# Patient Record
Sex: Female | Born: 1960 | Race: Black or African American | Hispanic: No | Marital: Married | Smoking: Current every day smoker
Health system: Southern US, Community
[De-identification: ages and names within clinical notes are randomized; demographics above are authoritative.]

## PROBLEM LIST (undated history)

## (undated) DIAGNOSIS — K219 Gastro-esophageal reflux disease without esophagitis: Secondary | ICD-10-CM

## (undated) DIAGNOSIS — R011 Cardiac murmur, unspecified: Secondary | ICD-10-CM

## (undated) DIAGNOSIS — I10 Essential (primary) hypertension: Secondary | ICD-10-CM

## (undated) DIAGNOSIS — IMO0002 Reserved for concepts with insufficient information to code with codable children: Secondary | ICD-10-CM

## (undated) HISTORY — DX: Cardiac murmur, unspecified: R01.1

## (undated) HISTORY — PX: WISDOM TOOTH EXTRACTION: SHX21

## (undated) HISTORY — PX: APPENDECTOMY: SHX54

## (undated) HISTORY — DX: Gastro-esophageal reflux disease without esophagitis: K21.9

## (undated) HISTORY — PX: TUBAL LIGATION: SHX77

## (undated) HISTORY — DX: Reserved for concepts with insufficient information to code with codable children: IMO0002

---

## 1998-07-24 ENCOUNTER — Emergency Department (HOSPITAL_COMMUNITY): Admission: EM | Admit: 1998-07-24 | Discharge: 1998-07-24 | Payer: Self-pay | Admitting: Family Medicine

## 2000-02-18 ENCOUNTER — Emergency Department (HOSPITAL_COMMUNITY): Admission: EM | Admit: 2000-02-18 | Discharge: 2000-02-18 | Payer: Self-pay | Admitting: Emergency Medicine

## 2000-02-18 ENCOUNTER — Encounter: Payer: Self-pay | Admitting: Emergency Medicine

## 2004-11-28 ENCOUNTER — Ambulatory Visit: Payer: Self-pay | Admitting: Internal Medicine

## 2004-11-29 ENCOUNTER — Ambulatory Visit: Payer: Self-pay | Admitting: *Deleted

## 2010-03-08 ENCOUNTER — Ambulatory Visit: Payer: Self-pay | Admitting: Family Medicine

## 2012-09-27 ENCOUNTER — Ambulatory Visit (INDEPENDENT_AMBULATORY_CARE_PROVIDER_SITE_OTHER): Payer: PRIVATE HEALTH INSURANCE | Admitting: Family Medicine

## 2012-09-27 ENCOUNTER — Ambulatory Visit: Payer: PRIVATE HEALTH INSURANCE

## 2012-09-27 VITALS — BP 152/82 | HR 83 | Temp 97.9°F | Resp 18 | Ht 60.0 in | Wt 144.0 lb

## 2012-09-27 DIAGNOSIS — Z8701 Personal history of pneumonia (recurrent): Secondary | ICD-10-CM

## 2012-09-27 DIAGNOSIS — R0602 Shortness of breath: Secondary | ICD-10-CM

## 2012-09-27 DIAGNOSIS — R0989 Other specified symptoms and signs involving the circulatory and respiratory systems: Secondary | ICD-10-CM

## 2012-09-27 DIAGNOSIS — R062 Wheezing: Secondary | ICD-10-CM

## 2012-09-27 MED ORDER — ALBUTEROL SULFATE HFA 108 (90 BASE) MCG/ACT IN AERS: 2.0000 | INHALATION_SPRAY | RESPIRATORY_TRACT | Status: DC | PRN

## 2012-09-27 MED ORDER — PREDNISONE 20 MG PO TABS: ORAL_TABLET | ORAL | Status: DC

## 2012-09-27 MED ORDER — FLUCONAZOLE 150 MG PO TABS: 150.0000 mg | ORAL_TABLET | Freq: Once | ORAL | Status: DC

## 2012-09-27 MED ORDER — ALBUTEROL SULFATE (2.5 MG/3ML) 0.083% IN NEBU
2.5000 mg | INHALATION_SOLUTION | Freq: Once | RESPIRATORY_TRACT | Status: AC
  Administered 2012-09-27: 2.5 mg via RESPIRATORY_TRACT

## 2012-09-27 MED ORDER — DOXYCYCLINE HYCLATE 100 MG PO CAPS: 100.0000 mg | ORAL_CAPSULE | Freq: Two times a day (BID) | ORAL | Status: DC

## 2012-09-27 NOTE — Patient Instructions (Addendum)
1. Wheezing  albuterol (PROVENTIL) (2.5 MG/3ML) 0.083% nebulizer solution 2.5 mg, DG Chest 2 View, predniSONE (DELTASONE) 20 MG tablet, albuterol (PROVENTIL HFA;VENTOLIN HFA) 108 (90 BASE) MCG/ACT inhaler  2. SOB (shortness of breath)  albuterol (PROVENTIL) (2.5 MG/3ML) 0.083% nebulizer solution 2.5 mg, DG Chest 2 View  3. Chest congestion  albuterol (PROVENTIL) (2.5 MG/3ML) 0.083% nebulizer solution 2.5 mg, DG Chest 2 View, doxycycline (VIBRAMYCIN) 100 MG capsule, fluconazole (DIFLUCAN) 150 MG tablet  4. Hx of bacterial pneumonia  albuterol (PROVENTIL) (2.5 MG/3ML) 0.083% nebulizer solution 2.5 mg, DG Chest 2 View

## 2012-09-27 NOTE — Progress Notes (Signed)
[address]   [place], [place]  [address]   [phone]  Subjective:    Patient ID: Jenny Montgomery, female    DOB: Jan 30, 1961, 51 y.o.   MRN: [id number]  HPIThis 51 y.o. female presents for evaluation of cough.  Onset of congestion in chest; onset one month.  +head congestion with improvement; now mostly in chest.  +intermittent fever Tmax 99.  +sweats; no chills.  Some headache; +ear pain B; no sore throat; +PND; no nasal congestion; no rhinorrhea; yellow sputum production; +cough mostly at night but still coughing during the day.  +SOB.  No history of asthma; +tobacco abuse 1 ppd.  +wheezing; last illness with pneumonia with wheezing.  No vomiting or diarrhea.  OTC medication everything: Mucinex, Tylenol.  Works as Lawyer at [employer].     Review of Systems  Constitutional: Positive for fever. Negative for chills, diaphoresis and fatigue.  HENT: Positive for ear pain, congestion, rhinorrhea and postnasal drip. Negative for sore throat, sneezing, drooling, mouth sores, trouble swallowing, dental problem, voice change and sinus pressure.   Respiratory: Positive for cough, shortness of breath and wheezing. Negative for stridor.   Cardiovascular: Negative for chest pain, palpitations and leg swelling.  Gastrointestinal: Negative for nausea, vomiting and diarrhea.  Skin: Negative for rash.       Past Medical History  Diagnosis Date  . Heart murmur   . Ulcer   . GERD (gastroesophageal reflux disease)     Past Surgical History  Procedure Date  . Wisdom tooth extraction   . Appendectomy   . Cesarean section     2 c sections    Prior to Admission medications   Not on File    No Known Allergies  History   Social History  . Marital Status: Married    Spouse Name: N/A    Number of Children: N/A  . Years of Education: N/A   Occupational History  . Not on file.   Social History Main Topics  . Smoking status: Current Every Day Smoker  . Smokeless tobacco: Not on  file  . Alcohol Use: No  . Drug Use: Not on file  . Sexually Active: Not on file   Other Topics Concern  . Not on file   Social History Narrative  . No narrative on file    Family History  Problem Relation Age of Onset  . Kidney disease Mother   . Hypertension Mother   . Anemia Mother   . Cancer Father     throat cancer  . Hypertension Father   . Heart disease Maternal Grandmother   . Glaucoma Maternal Grandmother     Objective:   Physical Exam  Nursing note and vitals reviewed. Constitutional: She appears well-developed and well-nourished. No distress.  HENT:  Head: Normocephalic and atraumatic.  Right Ear: External ear normal.  Left Ear: External ear normal.  Nose: Nose normal.  Mouth/Throat: Oropharynx is clear and moist. No oropharyngeal exudate.  Eyes: Conjunctivae normal and EOM are normal. Pupils are equal, round, and reactive to light.  Neck: Normal range of motion. Neck supple. No thyromegaly present.  Cardiovascular: Normal rate, regular rhythm, normal heart sounds and intact distal pulses.   No murmur heard. Pulmonary/Chest: No accessory muscle usage. Not tachypneic. No respiratory distress. She has wheezes in the right upper field, the right middle field, the right lower field, the left upper field, the left middle field and the left lower field. She has rhonchi in the right  upper field, the right middle field, the right lower field, the left upper field, the left middle field and the left lower field. She has no rales.       MODERATE AIR MOVEMENT; DIFFUSE WHEEZING AND RHONCHI THROUGHOUT.   NO TACHYPNEA; NO RETRACTIONS; NO ACCESSORY MUSCLE USE.  Musculoskeletal: She exhibits no edema.  Lymphadenopathy:    She has no cervical adenopathy.  Skin: Skin is warm and dry. No rash noted. She is not diaphoretic.  Psychiatric: She has a normal mood and affect. Her behavior is normal. Judgment and thought content normal.       [facility] reading (PRIMARY) by  Dr. Katrinka Blazing.   CXR: NAD  ALBUTEROL NEBULIZER ADMINISTERED DURING VISIT.  Improved air movement after nebulizer treatment; persistent rhonchi and wheezing; no tachypnea.   Assessment & Plan:   1. Wheezing  albuterol (PROVENTIL) (2.5 MG/3ML) 0.083% nebulizer solution 2.5 mg, DG Chest 2 View  2. SOB (shortness of breath)  albuterol (PROVENTIL) (2.5 MG/3ML) 0.083% nebulizer solution 2.5 mg, DG Chest 2 View  3. Chest congestion  albuterol (PROVENTIL) (2.5 MG/3ML) 0.083% nebulizer solution 2.5 mg, DG Chest 2 View  4. Hx of bacterial pneumonia  albuterol (PROVENTIL) (2.5 MG/3ML) 0.083% nebulizer solution 2.5 mg, DG Chest 2 View    1.  Acute Bronchitis:  New.  Treat with Doxycycline.  Supportive care with rest, fluids, Mucinex DM.  Rx for Diflucan also provided per patient request. 2.  Bronchospasm: New.  S/p Albuterol nebulizer in office; rx for Prednisone, Albuterol HFA to use q 6 hours scheduled for one week and then PRN.  Discussed acute bronchospasm highly suggestive of COPD; recommend spirometry in future when well.  RTC for acute worsening. 3. Tobacco Abuse: pre-contemplative; encourage cessation with current bronchospasm.  Meds ordered this encounter  Medications  . albuterol (PROVENTIL) (2.5 MG/3ML) 0.083% nebulizer solution 2.5 mg    Sig:   . doxycycline (VIBRAMYCIN) 100 MG capsule    Sig: Take 1 capsule (100 mg total) by mouth 2 (two) times daily.    Dispense:  20 capsule    Refill:  0  . predniSONE (DELTASONE) 20 MG tablet    Sig: Two tablets daily x 5 days then one tablet daily x 5 days    Dispense:  15 tablet    Refill:  0  . albuterol (PROVENTIL HFA;VENTOLIN HFA) 108 (90 BASE) MCG/ACT inhaler    Sig: Inhale 2 puffs into the lungs every 4 (four) hours as needed for wheezing (cough, shortness of breath or wheezing.).    Dispense:  1 Inhaler    Refill:  1  . fluconazole (DIFLUCAN) 150 MG tablet    Sig: Take 1 tablet (150 mg total) by mouth once. Repeat if needed    Dispense:  2 tablet     Refill:  0

## 2012-10-07 ENCOUNTER — Ambulatory Visit: Payer: PRIVATE HEALTH INSURANCE

## 2012-10-07 ENCOUNTER — Ambulatory Visit (INDEPENDENT_AMBULATORY_CARE_PROVIDER_SITE_OTHER): Payer: PRIVATE HEALTH INSURANCE | Admitting: Emergency Medicine

## 2012-10-07 VITALS — BP 162/88 | HR 75 | Temp 97.9°F | Resp 17 | Ht 60.0 in | Wt 145.0 lb

## 2012-10-07 DIAGNOSIS — J40 Bronchitis, not specified as acute or chronic: Secondary | ICD-10-CM

## 2012-10-07 DIAGNOSIS — J209 Acute bronchitis, unspecified: Secondary | ICD-10-CM

## 2012-10-07 DIAGNOSIS — R05 Cough: Secondary | ICD-10-CM

## 2012-10-07 MED ORDER — LEVOFLOXACIN 500 MG PO TABS: 500.0000 mg | ORAL_TABLET | Freq: Every day | ORAL | Status: DC

## 2012-10-07 MED ORDER — HYDROCOD POLST-CHLORPHEN POLST 10-8 MG/5ML PO LQCR: 5.0000 mL | Freq: Two times a day (BID) | ORAL | Status: DC | PRN

## 2012-10-07 NOTE — Progress Notes (Signed)
  Subjective:    Patient ID: Jenny Montgomery, female    DOB: 07-04-1961, 51 y.o.   MRN: [id number]  Back Pain This is a new problem. The current episode started in the past 7 days. The problem occurs constantly. The problem has been gradually worsening since onset. The quality of the pain is described as burning. The pain does not radiate. The symptoms are aggravated by coughing. Associated symptoms include chest pain and a fever. Pertinent negatives include no dysuria.  Chest Pain  This is a new problem. The current episode started in the past 7 days. The onset quality is gradual. The problem occurs constantly. The problem has been gradually worsening. The quality of the pain is described as sharp. The pain does not radiate. Associated symptoms include back pain, a cough (productive purulent sputum), a fever, malaise/fatigue and sputum production. Pertinent negatives include no nausea or shortness of breath. The cough has no precipitants. The cough is productive. There is no color change associated with the cough. Nothing relieves the cough. Nothing worsens the cough.      Review of Systems  Constitutional: Positive for fever, chills, malaise/fatigue and fatigue. Negative for activity change and appetite change.  HENT: Negative for congestion, rhinorrhea, postnasal drip and ear discharge.   Eyes: Negative for discharge, redness and itching.  Respiratory: Positive for cough (productive purulent sputum) and sputum production. Negative for shortness of breath.   Cardiovascular: Positive for chest pain.  Gastrointestinal: Negative for nausea.  Genitourinary: Negative for dysuria.  Musculoskeletal: Positive for back pain.  Skin: Negative.        Objective:   Physical Exam  Constitutional: She appears well-developed and well-nourished. No distress.  HENT:  Head: Normocephalic and atraumatic.  Right Ear: External ear normal.  Left Ear: External ear normal.  Nose: Nose normal.  Mouth/Throat:  Oropharynx is clear and moist.  Eyes: Conjunctivae normal and EOM are normal. Pupils are equal, round, and reactive to light. Right eye exhibits no discharge. No scleral icterus.  Neck: Normal range of motion. Neck supple.  Cardiovascular: Normal rate, regular rhythm and normal heart sounds.   Pulmonary/Chest: Effort normal. No respiratory distress. She has rales.  Abdominal: Soft. Bowel sounds are normal.  Musculoskeletal: Normal range of motion.  Lymphadenopathy:    She has no cervical adenopathy.  Neurological: She is alert.  Skin: Skin is warm and dry.          Assessment & Plan:  Bronchitis CXR  [facility] reading (PRIMARY) by  Dr. Dareen Piano.  Negative chest..

## 2012-10-09 NOTE — Progress Notes (Signed)
Reviewed and agree.

## 2012-10-14 NOTE — Progress Notes (Signed)
Reviewed and agree.

## 2013-08-20 ENCOUNTER — Ambulatory Visit (INDEPENDENT_AMBULATORY_CARE_PROVIDER_SITE_OTHER): Payer: PRIVATE HEALTH INSURANCE | Admitting: Family Medicine

## 2013-08-20 ENCOUNTER — Ambulatory Visit: Payer: PRIVATE HEALTH INSURANCE

## 2013-08-20 VITALS — BP 152/72 | HR 86 | Temp 97.7°F | Resp 16 | Ht 60.0 in | Wt 145.0 lb

## 2013-08-20 DIAGNOSIS — M719 Bursopathy, unspecified: Secondary | ICD-10-CM

## 2013-08-20 DIAGNOSIS — M25519 Pain in unspecified shoulder: Secondary | ICD-10-CM

## 2013-08-20 DIAGNOSIS — M7582 Other shoulder lesions, left shoulder: Secondary | ICD-10-CM

## 2013-08-20 DIAGNOSIS — M25512 Pain in left shoulder: Secondary | ICD-10-CM

## 2013-08-20 DIAGNOSIS — R209 Unspecified disturbances of skin sensation: Secondary | ICD-10-CM

## 2013-08-20 DIAGNOSIS — M67919 Unspecified disorder of synovium and tendon, unspecified shoulder: Secondary | ICD-10-CM

## 2013-08-20 DIAGNOSIS — R202 Paresthesia of skin: Secondary | ICD-10-CM

## 2013-08-20 MED ORDER — CYCLOBENZAPRINE HCL 5 MG PO TABS: 5.0000 mg | ORAL_TABLET | Freq: Three times a day (TID) | ORAL | Status: DC | PRN

## 2013-08-20 NOTE — Progress Notes (Signed)
[facility]:  Office Visit  Chief Complaint:  Chief Complaint  Patient presents with  . Arm Pain    3 weeks    HPI: Jenny Montgomery is a 52 y.o. female who complains of  Here with left arm and shoulder pain x 3 weeks, initially started with left shoulder and now has some more pain and swelling in arm and hand. Feels like a  Toothache, she feels some numbness, If  she moves it gets better. 10/10 pain when she ha severe pain but she normally has 8/10 intermittent pain. Has sharp pains occassionally. She works at [employer] and does a lot of lifting and using the lift machine to get people in and out of bed, baths, etc. She works as a Lawyer. She does not remember the exact moment she got injured, they do rotations so she lifts people of different body masses so it is not all the time that she stays with the same resident.  Denies diabetes. She  has some right sided shoulder pain. She has tried bayer aspirin but it tears up her stomach, she has tried tylenol. She denies any prior neck or shoulder injuries.  She is a smoker  1 ppd or less x 30-35 years.   Past Medical History  Diagnosis Date  . Heart murmur   . Ulcer   . GERD (gastroesophageal reflux disease)    Past Surgical History  Procedure Laterality Date  . Wisdom tooth extraction    . Appendectomy    . Cesarean section      2 c sections  . Tubal ligation     History   Social History  . Marital Status: Married    Spouse Name: N/A    Number of Children: N/A  . Years of Education: N/A   Social History Main Topics  . Smoking status: Current Every Day Smoker -- 0.50 packs/day for 30 years    Types: Cigarettes  . Smokeless tobacco: Never Used  . Alcohol Use: No  . Drug Use: No  . Sexual Activity: None   Other Topics Concern  . 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   Family History  Problem Relation Age of Onset  . Kidney disease Mother   . Hypertension Mother   . Anemia Mother   . Stroke Mother 83  . Cancer Father     throat cancer  . Hypertension Father   . Heart disease Maternal Grandmother   . Glaucoma Maternal Grandmother   . Diabetes Sister    Allergies  Allergen Reactions  . Ibuprofen Other (See Comments)    ulcers   Prior to Admission medications   Medication Sig Start Date End Date Taking? Authorizing Provider  albuterol (PROVENTIL HFA;VENTOLIN HFA) 108 (90 BASE) MCG/ACT inhaler Inhale 2 puffs into the lungs every 4 (four) hours as needed for wheezing (cough, shortness of breath or wheezing.). 09/27/12   Ethelda Chick, MD  chlorpheniramine-HYDROcodone (TUSSIONEX PENNKINETIC ER) 10-8 MG/5ML LQCR Take 5 mLs by mouth every 12 (twelve) hours as needed (cough). 10/07/12   Phillips Odor, MD  doxycycline (VIBRAMYCIN) 100 MG capsule Take 1 capsule (100 mg total) by mouth 2 (two) times daily. 09/27/12   Ethelda Chick, MD  fluconazole (DIFLUCAN) 150 MG tablet Take 1  tablet (150 mg total) by mouth once. Repeat if needed 09/27/12   Ethelda Chick, MD  levofloxacin (LEVAQUIN) 500 MG tablet Take 1 tablet (500 mg total) by mouth daily. 10/07/12   Phillips Odor, MD  predniSONE (DELTASONE) 20 MG tablet Two tablets daily x 5 days then one tablet daily x 5 days 09/27/12   Ethelda Chick, MD     ROS: The patient denies fevers, chills, night sweats, unintentional weight loss, chest pain, palpitations, wheezing, dyspnea on exertion, nausea, vomiting, abdominal pain, dysuria, hematuria, melena, numbness, weakness, or tingling.   All other systems have been reviewed and were otherwise negative with the exception of those mentioned in the HPI and as above.    PHYSICAL EXAM: Filed Vitals:   08/20/13 1327  BP: 152/72  Pulse: 86  Temp: 97.7 F (36.5 C)  Resp: 16   Filed Vitals:   08/20/13 1327  Height: 5' (1.524 m)  Weight: 145 lb  (65.772 kg)   Body mass index is 28.32 kg/(m^2).  General: Alert, no acute distress HEENT:  Normocephalic, atraumatic, oropharynx patent. EOMI, PERRLA Cardiovascular:  Regular rate and rhythm, no rubs murmurs or gallops.  No Carotid bruits, radial pulse intact. No pedal edema.  Respiratory: Clear to auscultation bilaterally.  No wheezes, rales, or rhonchi.  No cyanosis, no use of accessory musculature GI: No organomegaly, abdomen is soft and non-tender, positive bowel sounds.  No masses. Skin: No rashes. Neurologic: Facial musculature symmetric. Psychiatric: Patient is appropriate throughout our interaction. Lymphatic: No cervical lymphadenopathy Musculoskeletal: Gait intact. c spine-normal ROM, neg Spurling, non tender Left shoulder normal AROM/PROM , pain with overhead motion , +/-Neers, Hawkins Neg Speeds, Empty Can She has difficulty with Lift off test  But this is bilateral.   LABS: No results found for this or any previous visit.   EKG/XRAY:   Primary read interpreted by Dr. Conley Rolls at [facility]. No fx or dislocation of c spine No fx or dislocation of Shoulder    ASSESSMENT/PLAN: Encounter Diagnoses  Name Primary?  . Left shoulder pain Yes  . Paresthesia of left arm   . Rotator cuff tendonitis, left    Patient declined tramadol, declined steroids ( makes her gain weight), mobic or NSAID since it tears up her stomach Rest and  ROM exercises given, Work note given, avoid overhead repetitive motion during time off Rx flexeril prn F/u prn Gross sideeffects, risk and benefits, and alternatives of medications d/w patient. Patient is aware that all medications have potential sideeffects and we are unable to predict every sideeffect or drug-drug interaction that may occur.  Hamilton Capri PHUONG, DO 08/20/2013 4:28 PM

## 2013-08-20 NOTE — Patient Instructions (Signed)

## 2014-01-31 IMAGING — CR DG CERVICAL SPINE 2 OR 3 VIEWS
3 series · 3 of 3 positions shown · non-contrast
Comparison: None.

CLINICAL DATA: Left arm paresthesia.

CERVICAL SPINE - 2-3 VIEW

[lateral]
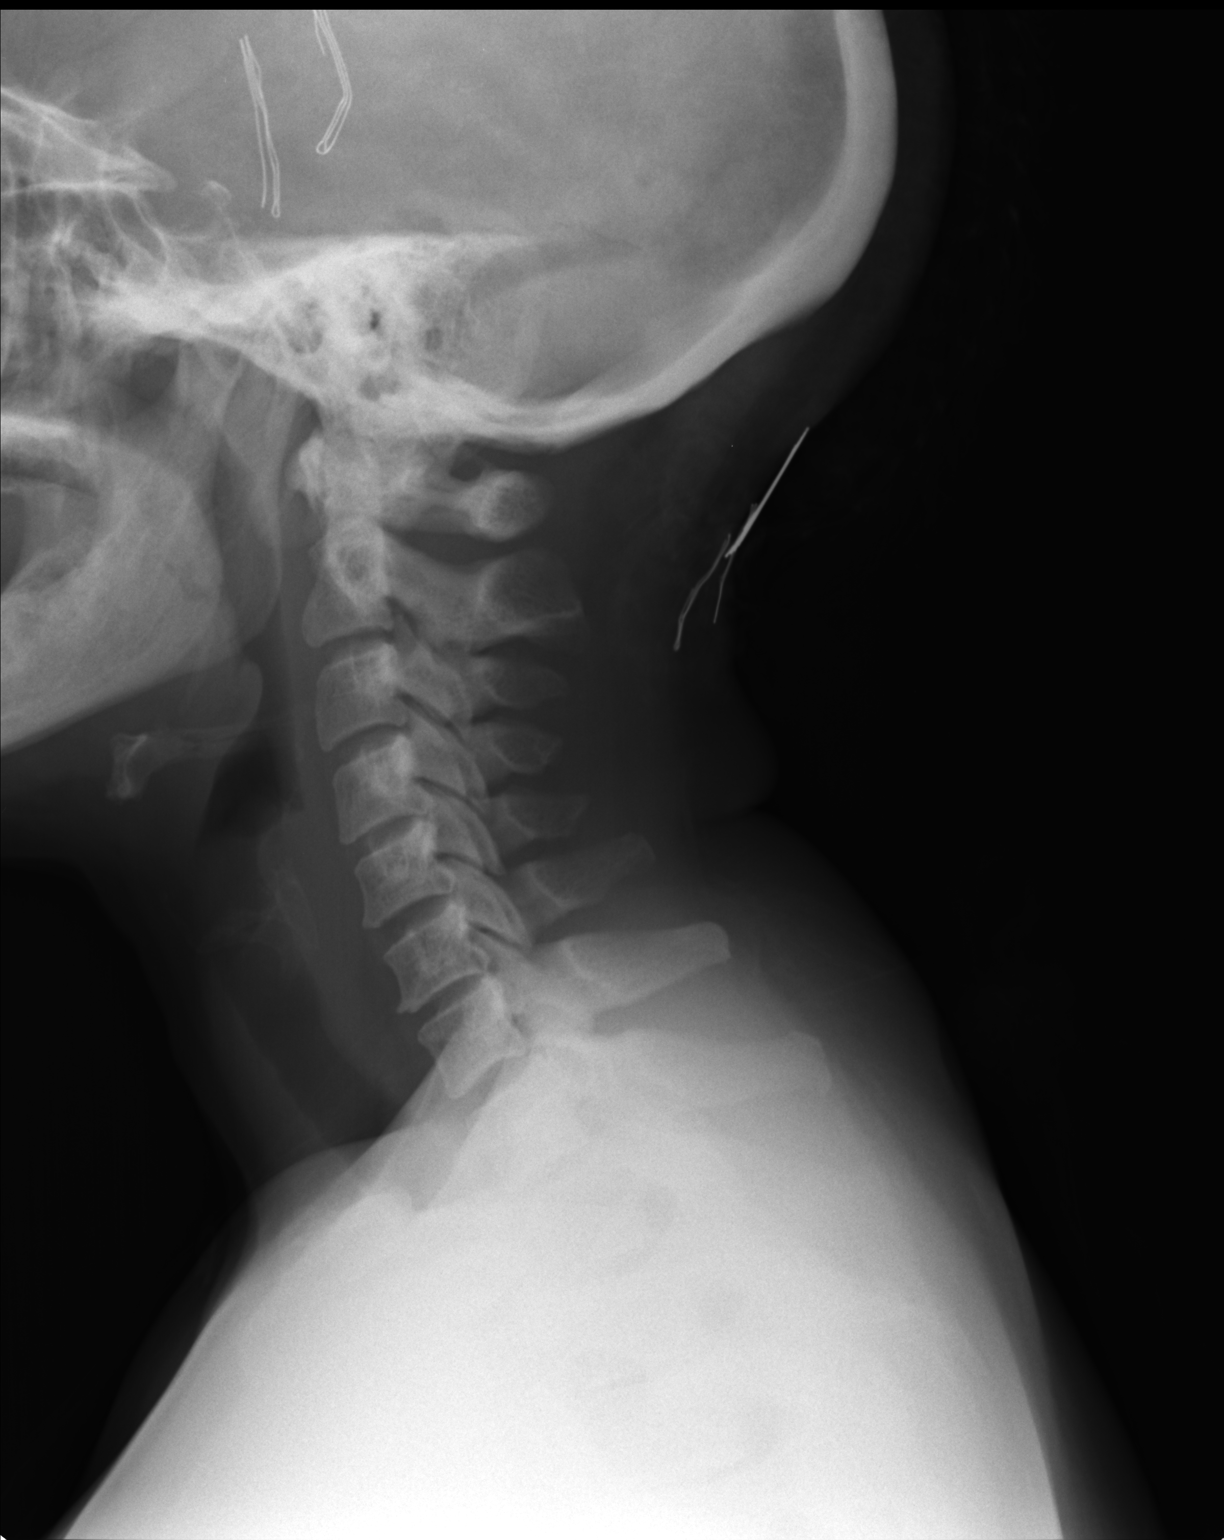

[AP (1 of 2)]
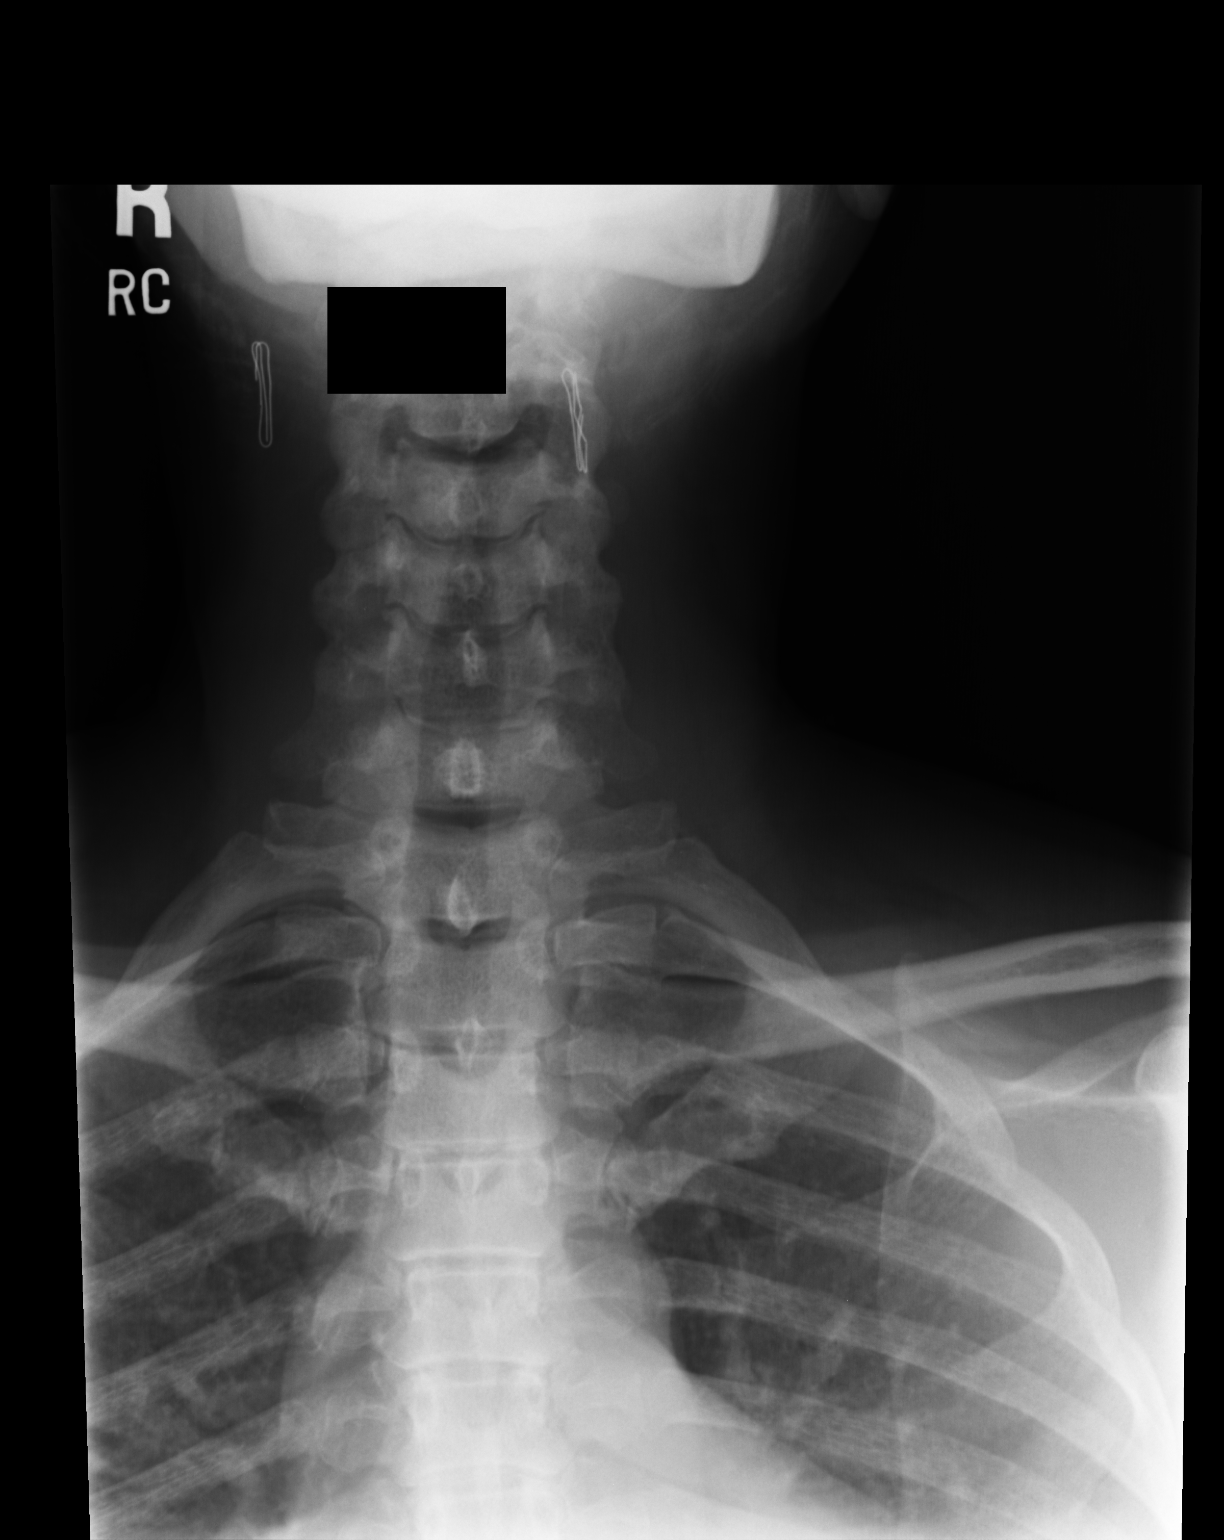

[AP (2 of 2)]
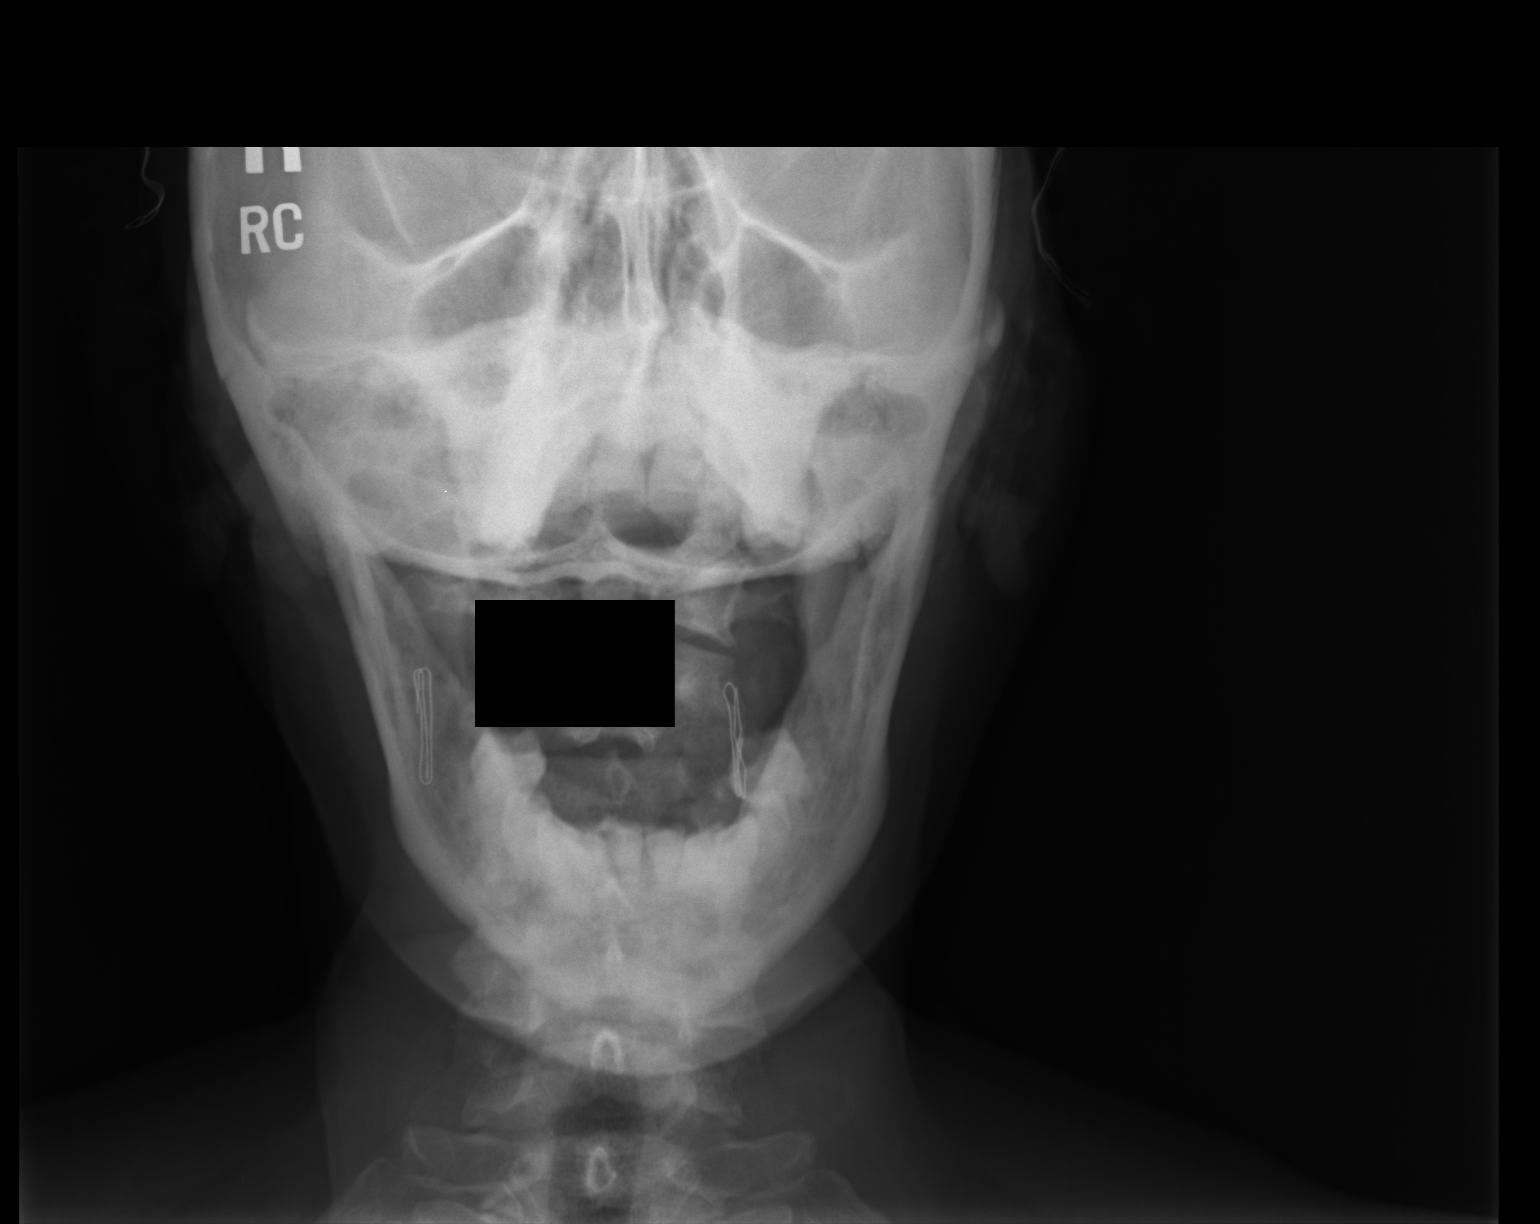

[3 of 3 positions shown; findings below may reference images not displayed]

FINDINGS: No fracture or spondylolisthesis is noted.  Mild
degenerative disc disease is noted at C6-7.  Posterior facet joints
appear normal.
IMPRESSION: Mild degenerative disc disease is noted at C6-7.  No acute
abnormality is seen in the cervical spine.

Clinically significant discrepancy from primary report, if
provided: None

## 2014-12-08 ENCOUNTER — Ambulatory Visit (INDEPENDENT_AMBULATORY_CARE_PROVIDER_SITE_OTHER): Payer: PRIVATE HEALTH INSURANCE | Admitting: Family Medicine

## 2014-12-08 VITALS — BP 142/84 | HR 81 | Temp 98.1°F | Resp 18 | Ht 59.75 in | Wt 148.0 lb

## 2014-12-08 DIAGNOSIS — L03115 Cellulitis of right lower limb: Secondary | ICD-10-CM

## 2014-12-08 DIAGNOSIS — J069 Acute upper respiratory infection, unspecified: Secondary | ICD-10-CM

## 2014-12-08 MED ORDER — DOXYCYCLINE HYCLATE 100 MG PO CAPS: 100.0000 mg | ORAL_CAPSULE | Freq: Two times a day (BID) | ORAL | Status: DC

## 2014-12-08 MED ORDER — FLUCONAZOLE 150 MG PO TABS: 150.0000 mg | ORAL_TABLET | Freq: Once | ORAL | Status: DC

## 2014-12-08 MED ORDER — PREDNISONE 20 MG PO TABS: ORAL_TABLET | ORAL | Status: DC

## 2014-12-08 NOTE — Patient Instructions (Addendum)
Drink plenty of fluids and get enough rest for the cold.   Take over-the-counter Robitussin-DM if the cough is persisting.  Take doxycycline 1 twice daily for infection of leg and lungs  If you develop any yeastlike symptoms take the fluconazole single dose. May repeat in 2 or 3 days if any symptoms persist.  Return in 3 or 4 weeks to recheck on your blood pressure. In the meanwhile try to monitor the blood pressure elsewhere at your job or home and write them down and bring the numbers in with you.   It is very important that you stop smoking. Pick a target date such as February 1, cut back by one cigarette a day each week until you get down to about for 5 cigarettes a day by your quit date, then walk away from them completely. Microplates to self that you will never smoker no one.

## 2014-12-08 NOTE — Progress Notes (Signed)
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 She was driving and felt a itching stinging sensation of her right lower extremity. Thought she might of gotten bit by something. She was not able to look down her leg time because she was driving. She developed erythematous area about 4 cm in diameter with a little head to it. Her friend tried to squeeze the content is out of it yesterday and got a little stuff. No insect was seen. Does not have any other places on her.  She has been fighting a respiratory tract infection. She has had congestion and drainage and cough. She says she has to cough hard to try to work mucus up. She is a smoker.  Objective: TMs normal. Throat clear. Neck supple without nodes. Chest is clear. Heart regular without murmurs. She has a 4 cm diameter erythematous area on the right lower shin laterally that has a little excoriated top to it. There is no active drainage right now. A 20-gauge needle was used to open it enough to squeeze a little bloody contents. This was cultured.  Patient has fears of a yeast infection and wants some Diflucan.  Assessment: Probable cellulitis and abscess right lower extremity versus insect bite Respiratory infection Tobacco use disorder Elevated blood pressure  Plan: Doxycycline 1 twice daily  fluconazole in case she develops a yeast infection The doxycycline would cover respiratory bacteria though I think that this is probably a viral upper respiratory infection Repeat blood pressure reading and come down to 142/84. However with it spiking up so high when she came in, she needs to be monitoring this carefully. Advised her to follow-up here or elsewhere in the near future.

## 2014-12-11 LAB — WOUND CULTURE
Gram Stain: NONE SEEN
Gram Stain: NONE SEEN
Organism ID, Bacteria: NO GROWTH

## 2015-01-12 ENCOUNTER — Ambulatory Visit (INDEPENDENT_AMBULATORY_CARE_PROVIDER_SITE_OTHER): Payer: PRIVATE HEALTH INSURANCE | Admitting: Internal Medicine

## 2015-01-12 VITALS — BP 132/66 | HR 100 | Temp 98.1°F | Resp 18 | Ht 59.0 in | Wt 147.2 lb

## 2015-01-12 DIAGNOSIS — G4700 Insomnia, unspecified: Secondary | ICD-10-CM

## 2015-01-12 DIAGNOSIS — Z634 Disappearance and death of family member: Secondary | ICD-10-CM

## 2015-01-12 MED ORDER — LORAZEPAM 0.5 MG PO TABS: 0.5000 mg | ORAL_TABLET | Freq: Two times a day (BID) | ORAL | Status: DC | PRN

## 2015-01-12 NOTE — Patient Instructions (Signed)
I think you are doing very well. I am sorry for your loss, please let us know if we can help in any way. We have given you the note for 2 weeks. I have sent in a script for 20 ativan pills. Please take these up to twice daily as needed for stress/anxiety. If you don't feel like you need them, that's ok, you definitely don't need to take them. If you feel like you need another short supply after this amount is gone, please call to let me know.  I think you are doing very well and hope that as time goes on you'll continue to slowly feel better.

## 2015-01-12 NOTE — Progress Notes (Signed)
   Subjective:    Patient ID: Jenny Montgomery, female    DOB: 10/11/1961, 54 y.o.   MRN: [id number]  Chief Complaint  Patient presents with  . Depression    lost her son 3 days ago  . need note for 2 weeks for job   Medications, allergies, past medical history, surgical history, family history, social history and problem list reviewed and updated.  HPI  89 yof with no pertinent pmh presents after losing son unexpectedly 3 days ago.   [place] gf and son. He suddenly passed 3 days ago. Pt is understandably having difficulty dealing with this. She states they were somewhat close, seeing each other every few weeks. She has another son who also lives [place].   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 She feels she is doing ok at this point. She is able to get out of bed in the morning. She is not blaming herself over the loss or having any desire to join him.   She is having trouble falling asleep the past couple nights. Thoughts racing with missing her son along with stressing over affording the funeral. An RN mentioned to her she should get a benzo to help. Pt has never been diagnosed with anxiety, depression, insomnia or taken any antidepressants or benzos.   Review of Systems No cp, sob.     Objective:   Physical Exam  Constitutional: She is oriented to person, place, and time. She appears well-developed and well-nourished.  Non-toxic appearance. She does not have a sickly appearance. She does not appear ill. No distress.  BP 132/66 mmHg  Pulse 100  Temp(Src) 98.1 F (36.7 C) (Oral)  Resp 18  Ht 4\' 11"  (1.499 m)  Wt 147 lb 3.2 oz (66.769 kg)  BMI 29.71 kg/m2  SpO2 99%   Neurological: She is alert and oriented to person, place, and time.  Psychiatric: Her speech is normal and behavior is normal. Judgment and thought content normal. Her mood appears anxious. Cognition and memory are normal.  She does not exhibit a depressed mood.  Somewhat anxious while talking. Eyes watering.       Assessment & Plan:   [age] yof with no pertinent pmh presents after losing son unexpectedly 3 days ago.   Bereavement without complication - Plan: LORazepam (ATIVAN) 0.5 MG tablet Insomnia - Plan: LORazepam (ATIVAN) 0.5 MG tablet --pt going through grieving process, no signs of complicated grief at this time --having trouble sleeping with mind racing --not feeling depressed --ativan 0.5 bid prn, #20. Pt to contact office if she desires refill, otherwise f/u as needed  Donnajean Lopesodd M. Tommye Lehenbauer, PA-C Physician Assistant-Certified [facility]  01/12/2015 4:17 PM  I have participated in the care of this patient with the Advanced Practice Provider and agree with Diagnosis and Plan as documented. Robert P. Merla Richesoolittle, M.D.

## 2015-05-19 ENCOUNTER — Encounter: Payer: Self-pay | Admitting: *Deleted

## 2015-09-06 ENCOUNTER — Ambulatory Visit (INDEPENDENT_AMBULATORY_CARE_PROVIDER_SITE_OTHER): Payer: PRIVATE HEALTH INSURANCE | Admitting: Internal Medicine

## 2015-09-06 ENCOUNTER — Ambulatory Visit (INDEPENDENT_AMBULATORY_CARE_PROVIDER_SITE_OTHER): Payer: PRIVATE HEALTH INSURANCE

## 2015-09-06 VITALS — BP 158/88 | HR 91 | Temp 98.2°F | Resp 18 | Ht 60.0 in | Wt 149.0 lb

## 2015-09-06 DIAGNOSIS — J209 Acute bronchitis, unspecified: Secondary | ICD-10-CM

## 2015-09-06 DIAGNOSIS — R071 Chest pain on breathing: Secondary | ICD-10-CM | POA: Diagnosis not present

## 2015-09-06 DIAGNOSIS — Z720 Tobacco use: Secondary | ICD-10-CM | POA: Diagnosis not present

## 2015-09-06 DIAGNOSIS — F17200 Nicotine dependence, unspecified, uncomplicated: Secondary | ICD-10-CM

## 2015-09-06 MED ORDER — ALBUTEROL SULFATE HFA 108 (90 BASE) MCG/ACT IN AERS
2.0000 | INHALATION_SPRAY | Freq: Four times a day (QID) | RESPIRATORY_TRACT | Status: DC | PRN
Start: 2015-09-06 — End: 2016-01-28

## 2015-09-06 MED ORDER — HYDROCODONE-ACETAMINOPHEN 7.5-325 MG/15ML PO SOLN: 5.0000 mL | Freq: Four times a day (QID) | ORAL | Status: DC | PRN

## 2015-09-06 MED ORDER — AZITHROMYCIN 500 MG PO TABS: 500.0000 mg | ORAL_TABLET | Freq: Every day | ORAL | Status: DC

## 2015-09-06 NOTE — Progress Notes (Signed)
Patient ID: Jenny Montgomery, female   DOB: 17-Nov-1961, 54 y.o.   MRN: [id number]   09/06/2015 at 1:07 PM  Fredirick Lathe / DOB: [birth year] / MRN: [id number]  Problem list reviewed and updated by me where necessary.   SUBJECTIVE  Jenny Montgomery is a 55 y.o. well appearing female presenting for the chief complaint of cough and congestion with dark sputum production.  Has been sick 4 weeks. No fever, able to work but fatigued.     She  has a past medical history of Heart murmur; Ulcer; and GERD (gastroesophageal reflux disease).    Medications reviewed and updated by myself where necessary, and exist elsewhere in the encounter.   [name] is allergic to ibuprofen. She  reports that she has been smoking Cigarettes.  She has a 15 pack-year smoking history. She has never used smokeless tobacco. She reports that she does not drink alcohol or use illicit drugs. She  has no sexual activity history on file. The patient  has past surgical history that includes Wisdom tooth extraction; Appendectomy; Cesarean section; and Tubal ligation.  Her family history includes Anemia in her mother; Cancer in her father; Diabetes in her sister; Glaucoma in her maternal grandmother; Heart attack in her son; Heart disease in her maternal grandmother; Hypertension in her father and mother; Kidney disease in her mother; Stroke (age of onset: 40) in her mother.  Review of Systems  Constitutional: Positive for fever, malaise/fatigue and diaphoresis.  HENT: Positive for congestion.   Respiratory: Positive for cough, sputum production and wheezing. Negative for shortness of breath.   Cardiovascular: Negative for chest pain.  Gastrointestinal: Negative for nausea.  Skin: Negative for rash.  Neurological: Negative for dizziness and headaches.    OBJECTIVE  Her  height is 5' (1.524 m) and weight is 149 lb (67.586 kg). Her oral temperature is 98.2 F (36.8 C). Her blood pressure is 158/88 and her pulse is 91. Her  respiration is 18 and oxygen saturation is 99%.  The patient's body mass index is 29.1 kg/(m^2).  Physical Exam  Constitutional: She is oriented to person, place, and time. She appears well-developed and well-nourished.  HENT:  Head: Normocephalic.  Right Ear: External ear normal.  Left Ear: External ear normal.  Nose: Nose normal.  Mouth/Throat: Oropharynx is clear and moist.  Eyes: Conjunctivae are normal. No scleral icterus.  Neck: Normal range of motion.  Cardiovascular: Normal rate and normal heart sounds.   Respiratory: Effort normal. No tachypnea. No respiratory distress. She has no decreased breath sounds. She has wheezes. She has rhonchi. She has no rales.    Pleuritic pain  GI: She exhibits no distension.  Musculoskeletal: Normal range of motion.  Lymphadenopathy:    She has no cervical adenopathy.  Neurological: She is alert and oriented to person, place, and time. Coordination normal.  Skin: Skin is warm and dry.  Psychiatric: She has a normal mood and affect.   [facility] reading (PRIMARY) by  [email or web address] no infiltrate and no mass seen   No results found for this or any previous visit (from the past 24 hour(s)).  ASSESSMENT & PLAN  [name] was seen today for cough, back pain, nasal congestion and sinusitis.  Diagnoses and all orders for this visit:  Chest pain on breathing -     POCT CBC -     DG Chest 2 View; Future -     azithromycin (ZITHROMAX) 500 MG tablet; Take 1 tablet (500 mg total) by mouth daily. -  HYDROcodone-acetaminophen (HYCET) 7.5-325 mg/15 ml solution; Take 5 mLs by mouth every 6 (six) hours as needed (or cough). -     albuterol (PROVENTIL HFA;VENTOLIN HFA) 108 (90 BASE) MCG/ACT inhaler; Inhale 2 puffs into the lungs every 6 (six) hours as needed for wheezing or shortness of breath.  Acute bronchitis, unspecified organism -     POCT CBC -     DG Chest 2 View; Future -     azithromycin (ZITHROMAX) 500 MG tablet; Take 1 tablet (500 mg total) by  mouth daily. -     HYDROcodone-acetaminophen (HYCET) 7.5-325 mg/15 ml solution; Take 5 mLs by mouth every 6 (six) hours as needed (or cough). -     albuterol (PROVENTIL HFA;VENTOLIN HFA) 108 (90 BASE) MCG/ACT inhaler; Inhale 2 puffs into the lungs every 6 (six) hours as needed for wheezing or shortness of breath.  Smoker -     albuterol (PROVENTIL HFA;VENTOLIN HFA) 108 (90 BASE) MCG/ACT inhaler; Inhale 2 puffs into the lungs every 6 (six) hours as needed for wheezing or shortness of breath.  QUIT SMOKING !

## 2015-09-06 NOTE — Patient Instructions (Signed)
Acute Bronchitis Bronchitis is inflammation of the airways that extend from the windpipe into the lungs (bronchi). The inflammation often causes mucus to develop. This leads to a cough, which is the most common symptom of bronchitis.  In acute bronchitis, the condition usually develops suddenly and goes away over time, usually in a couple weeks. Smoking, allergies, and asthma can make bronchitis worse. Repeated episodes of bronchitis may cause further lung problems.  CAUSES Acute bronchitis is most often caused by the same virus that causes a cold. The virus can spread from person to person (contagious) through coughing, sneezing, and touching contaminated objects. SIGNS AND SYMPTOMS   Cough.   Fever.   Coughing up mucus.   Body aches.   Chest congestion.   Chills.   Shortness of breath.   Sore throat.  DIAGNOSIS  Acute bronchitis is usually diagnosed through a physical exam. Your health care provider will also ask you questions about your medical history. Tests, such as chest X-rays, are sometimes done to rule out other conditions.  TREATMENT  Acute bronchitis usually goes away in a couple weeks. Oftentimes, no medical treatment is necessary. Medicines are sometimes given for relief of fever or cough. Antibiotic medicines are usually not needed but may be prescribed in certain situations. In some cases, an inhaler may be recommended to help reduce shortness of breath and control the cough. A cool mist vaporizer may also be used to help thin bronchial secretions and make it easier to clear the chest.  HOME CARE INSTRUCTIONS  Get plenty of rest.   Drink enough fluids to keep your urine clear or pale yellow (unless you have a medical condition that requires fluid restriction). Increasing fluids may help thin your respiratory secretions (sputum) and reduce chest congestion, and it will prevent dehydration.   Take medicines only as directed by your health care provider.  If  you were prescribed an antibiotic medicine, finish it all even if you start to feel better.  Avoid smoking and secondhand smoke. Exposure to cigarette smoke or irritating chemicals will make bronchitis worse. If you are a smoker, consider using nicotine gum or skin patches to help control withdrawal symptoms. Quitting smoking will help your lungs heal faster.   Reduce the chances of another bout of acute bronchitis by washing your hands frequently, avoiding people with cold symptoms, and trying not to touch your hands to your mouth, nose, or eyes.   Keep all follow-up visits as directed by your health care provider.  SEEK MEDICAL CARE IF: Your symptoms do not improve after 1 week of treatment.  SEEK IMMEDIATE MEDICAL CARE IF:  You develop an increased fever or chills.   You have chest pain.   You have severe shortness of breath.  You have bloody sputum.   You develop dehydration.  You faint or repeatedly feel like you are going to pass out.  You develop repeated vomiting.  You develop a severe headache. MAKE SURE YOU:   Understand these instructions.  Will watch your condition.  Will get help right away if you are not doing well or get worse. Document Released: 01/04/2005 Document Revised: 04/13/2014 Document Reviewed: 05/20/2013 ExitCare Patient Information 2015 [employer]. This information is not intended to replace advice given to you by your health care provider. Make sure you discuss any questions you have with your health care provider. Smoking Cessation Quitting smoking is important to your health and has many advantages. However, it is not always easy to quit since nicotine is   a very addictive drug. Oftentimes, people try 3 times or more before being able to quit. This document explains the best ways for you to prepare to quit smoking. Quitting takes hard work and a lot of effort, but you can do it. ADVANTAGES OF QUITTING SMOKING  You will live longer, feel  better, and live better.  Your body will feel the impact of quitting smoking almost immediately.  Within 20 minutes, blood pressure decreases. Your pulse returns to its normal level.  After 8 hours, carbon monoxide levels in the blood return to normal. Your oxygen level increases.  After 24 hours, the chance of having a heart attack starts to decrease. Your breath, hair, and body stop smelling like smoke.  After 48 hours, damaged nerve endings begin to recover. Your sense of taste and smell improve.  After 72 hours, the body is virtually free of nicotine. Your bronchial tubes relax and breathing becomes easier.  After 2 to 12 weeks, lungs can hold more air. Exercise becomes easier and circulation improves.  The risk of having a heart attack, stroke, cancer, or lung disease is greatly reduced.  After 1 year, the risk of coronary heart disease is cut in half.  After 5 years, the risk of stroke falls to the same as a nonsmoker.  After 10 years, the risk of lung cancer is cut in half and the risk of other cancers decreases significantly.  After 15 years, the risk of coronary heart disease drops, usually to the level of a nonsmoker.  If you are pregnant, quitting smoking will improve your chances of having a healthy baby.  The people you live with, especially any children, will be healthier.  You will have extra money to spend on things other than cigarettes. QUESTIONS TO THINK ABOUT BEFORE ATTEMPTING TO QUIT You may want to talk about your answers with your health care provider.  Why do you want to quit?  If you tried to quit in the past, what helped and what did not?  What will be the most difficult situations for you after you quit? How will you plan to handle them?  Who can help you through the tough times? Your family? Friends? A health care provider?  What pleasures do you get from smoking? What ways can you still get pleasure if you quit? Here are some questions to ask  your health care provider:  How can you help me to be successful at quitting?  What medicine do you think would be best for me and how should I take it?  What should I do if I need more help?  What is smoking withdrawal like? How can I get information on withdrawal? GET READY  Set a quit date.  Change your environment by getting rid of all cigarettes, ashtrays, matches, and lighters in your home, car, or work. Do not let people smoke in your home.  Review your past attempts to quit. Think about what worked and what did not. GET SUPPORT AND ENCOURAGEMENT You have a better chance of being successful if you have help. You can get support in many ways.  Tell your family, friends, and coworkers that you are going to quit and need their support. Ask them not to smoke around you.  Get individual, group, or telephone counseling and support. Programs are available at local hospitals and health centers. Call your local health department for information about programs in your area.  Spiritual beliefs and practices may help some smokers quit.  Download   a "quit meter" on your computer to keep track of quit statistics, such as how long you have gone without smoking, cigarettes not smoked, and money saved.  Get a self-help book about quitting smoking and staying off tobacco. LEARN NEW SKILLS AND BEHAVIORS  Distract yourself from urges to smoke. Talk to someone, go for a walk, or occupy your time with a task.  Change your normal routine. Take a different route to work. Drink tea instead of coffee. Eat breakfast in a different place.  Reduce your stress. Take a hot bath, exercise, or read a book.  Plan something enjoyable to do every day. Reward yourself for not smoking.  Explore interactive web-based programs that specialize in helping you quit. GET MEDICINE AND USE IT CORRECTLY Medicines can help you stop smoking and decrease the urge to smoke. Combining medicine with the above behavioral  methods and support can greatly increase your chances of successfully quitting smoking.  Nicotine replacement therapy helps deliver nicotine to your body without the negative effects and risks of smoking. Nicotine replacement therapy includes nicotine gum, lozenges, inhalers, nasal sprays, and skin patches. Some may be available over-the-counter and others require a prescription.  Antidepressant medicine helps people abstain from smoking, but how this works is unknown. This medicine is available by prescription.  Nicotinic receptor partial agonist medicine simulates the effect of nicotine in your brain. This medicine is available by prescription. Ask your health care provider for advice about which medicines to use and how to use them based on your health history. Your health care provider will tell you what side effects to look out for if you choose to be on a medicine or therapy. Carefully read the information on the package. Do not use any other product containing nicotine while using a nicotine replacement product.  RELAPSE OR DIFFICULT SITUATIONS Most relapses occur within the first 3 months after quitting. Do not be discouraged if you start smoking again. Remember, most people try several times before finally quitting. You may have symptoms of withdrawal because your body is used to nicotine. You may crave cigarettes, be irritable, feel very hungry, cough often, get headaches, or have difficulty concentrating. The withdrawal symptoms are only temporary. They are strongest when you first quit, but they will go away within 10-14 days. To reduce the chances of relapse, try to:  Avoid drinking alcohol. Drinking lowers your chances of successfully quitting.  Reduce the amount of caffeine you consume. Once you quit smoking, the amount of caffeine in your body increases and can give you symptoms, such as a rapid heartbeat, sweating, and anxiety.  Avoid smokers because they can make you want to  smoke.  Do not let weight gain distract you. Many smokers will gain weight when they quit, usually less than 10 pounds. Eat a healthy diet and stay active. You can always lose the weight gained after you quit.  Find ways to improve your mood other than smoking. FOR MORE INFORMATION  www.smokefree.gov  Document Released: 11/21/2001 Document Revised: 04/13/2014 Document Reviewed: 03/07/2012 ExitCare Patient Information 2015 [employer]. This information is not intended to replace advice given to you by your health care provider. Make sure you discuss any questions you have with your health care provider.  

## 2015-12-31 ENCOUNTER — Encounter: Payer: Self-pay | Admitting: Family Medicine

## 2016-01-05 ENCOUNTER — Encounter: Payer: Self-pay | Admitting: Family Medicine

## 2016-01-28 ENCOUNTER — Ambulatory Visit (INDEPENDENT_AMBULATORY_CARE_PROVIDER_SITE_OTHER): Payer: PRIVATE HEALTH INSURANCE | Admitting: Physician Assistant

## 2016-01-28 VITALS — BP 128/80 | HR 93 | Temp 98.8°F | Resp 16 | Ht 59.0 in | Wt 153.0 lb

## 2016-01-28 DIAGNOSIS — R05 Cough: Secondary | ICD-10-CM | POA: Diagnosis not present

## 2016-01-28 DIAGNOSIS — B9789 Other viral agents as the cause of diseases classified elsewhere: Secondary | ICD-10-CM

## 2016-01-28 DIAGNOSIS — R062 Wheezing: Secondary | ICD-10-CM | POA: Diagnosis not present

## 2016-01-28 DIAGNOSIS — J069 Acute upper respiratory infection, unspecified: Secondary | ICD-10-CM

## 2016-01-28 DIAGNOSIS — J45901 Unspecified asthma with (acute) exacerbation: Secondary | ICD-10-CM | POA: Diagnosis not present

## 2016-01-28 DIAGNOSIS — R059 Cough, unspecified: Secondary | ICD-10-CM

## 2016-01-28 MED ORDER — IPRATROPIUM BROMIDE 0.02 % IN SOLN
0.5000 mg | Freq: Once | RESPIRATORY_TRACT | Status: AC
  Administered 2016-01-28: 0.5 mg via RESPIRATORY_TRACT

## 2016-01-28 MED ORDER — HYDROCOD POLST-CPM POLST ER 10-8 MG/5ML PO SUER: 5.0000 mL | Freq: Every evening | ORAL | Status: DC | PRN

## 2016-01-28 MED ORDER — ALBUTEROL SULFATE HFA 108 (90 BASE) MCG/ACT IN AERS: 2.0000 | INHALATION_SPRAY | RESPIRATORY_TRACT | Status: DC | PRN

## 2016-01-28 MED ORDER — ALBUTEROL SULFATE (2.5 MG/3ML) 0.083% IN NEBU
2.5000 mg | INHALATION_SOLUTION | Freq: Once | RESPIRATORY_TRACT | Status: AC
  Administered 2016-01-28: 2.5 mg via RESPIRATORY_TRACT

## 2016-01-28 MED ORDER — BENZONATATE 100 MG PO CAPS: 100.0000 mg | ORAL_CAPSULE | Freq: Three times a day (TID) | ORAL | Status: DC | PRN

## 2016-01-28 NOTE — Progress Notes (Signed)
[facility] [address], [place] [address] [phone]  Date:  01/28/2016   Name:  Jenny Montgomery   DOB:  1961/09/26   MRN:  [id number]  PCP:  Abbe Amsterdam, MD    History of Present Illness:  Jenny Montgomery is a 55 y.o. female patient who presents to [facility] for cc of fever, malaise and body aches.    4 days ago with sniffling and sneezing.  Subjective fever and chills.  The next day, she was having general malaise and body aches.  Coughing which has improved some.  Yellow mucus productive.  Nasal congestion and rhinorrhea.  Ear pain bilaterally which appears to be resolved.  She has been taking tissue and placing in her ear which helps.   She has a hx of asthma exacerbation, and ordinarily will use an albuterol, but did not have a refill of an inhaler at this time.   Mucinex which helps somewhat.    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   There are no active problems to display for this patient.   Past Medical History  Diagnosis Date  . Heart murmur   . Ulcer   . GERD (gastroesophageal reflux disease)     Past Surgical History  Procedure Laterality Date  . Wisdom tooth extraction    . Appendectomy    . Cesarean section      2 c sections  . Tubal ligation      Social History  Substance Use Topics  . Smoking status: Current Every Day Smoker -- 0.50 packs/day for 30 years    Types: Cigarettes  . Smokeless tobacco: Never Used  . Alcohol Use: No    Family History  Problem Relation Age of Onset  . Kidney disease Mother   . Hypertension Mother   . Anemia Mother   . Stroke Mother 62  . Cancer Father     throat cancer  . Hypertension Father   . Heart disease Maternal Grandmother   . Glaucoma Maternal Grandmother   . Diabetes Sister   . Heart attack Son     Allergies  Allergen Reactions  . Ibuprofen Other (See Comments)    ulcers    Medication list has been reviewed and updated.  No current outpatient prescriptions on file prior to visit.    No current facility-administered medications on file prior to visit.    ROS   Physical Examination: BP 128/80 mmHg  Pulse 93  Temp(Src) 98.8 F (37.1 C) (Oral)  Resp 16  Ht  (1.499 m)  Wt 153 lb (69.4 kg)  BMI 30.89 kg/m2  SpO2 99% Ideal Body Weight: Weight in (lb) to have BMI = 25: 123.5  Physical Exam  Constitutional: She is oriented to person, place, and time. She appears well-developed and well-nourished. No distress.  HENT:  Head: Normocephalic and atraumatic.  Right Ear: Tympanic membrane, external ear and ear canal normal.  Left Ear: Tympanic membrane, external ear and ear canal normal.  Nose: Mucosal edema and rhinorrhea present. Right sinus exhibits no maxillary sinus tenderness and no frontal sinus tenderness. Left sinus exhibits no maxillary sinus tenderness and no frontal sinus tenderness.  Mouth/Throat: No uvula swelling. No oropharyngeal exudate, posterior oropharyngeal edema or posterior oropharyngeal erythema.  Eyes: Conjunctivae and EOM are normal. Pupils are equal, round, and reactive to light.  Cardiovascular: Normal rate and regular rhythm.  Exam reveals no gallop, no distant heart sounds and no friction rub.   No murmur heard.  Pulmonary/Chest: Effort normal. No respiratory distress. She has no decreased breath sounds. She has no wheezes (minimal wheezing). She has rhonchi.  Lymphadenopathy:       Head (right side): No submandibular, no tonsillar, no preauricular and no posterior auricular adenopathy present.       Head (left side): No submandibular, no tonsillar, no preauricular and no posterior auricular adenopathy present.    She has no cervical adenopathy.  Neurological: She is alert and oriented to person, place, and time.  Skin: She is not diaphoretic.  Psychiatric: She has a normal mood and affect. Her behavior is normal.   peakflow increase from 250-->350 post peak flow.   Assessment and Plan: Jenny Montgomery is a 55 y.o. female who is  here today for wheezing cough, and congestion This appears to be viral at this time.  She states that she is having improvement so we will treat supportively. I have advised her to contact if her sxs worsen to productive cough, fever, or if her sxs do not improve within 4 days.  Patient voiced understanding. Refilling her albuterol at this time.  She will take every 4 hours for the next 24 hours.  She declines cbc at this time.   Wheezing - Plan: albuterol (PROVENTIL) (2.5 MG/3ML) 0.083% nebulizer solution 2.5 mg, ipratropium (ATROVENT) nebulizer solution 0.5 mg, albuterol (PROVENTIL HFA;VENTOLIN HFA) 108 (90 Base) MCG/ACT inhaler, CANCELED: POCT CBC  Cough - Plan: albuterol (PROVENTIL HFA;VENTOLIN HFA) 108 (90 Base) MCG/ACT inhaler, chlorpheniramine-HYDROcodone (TUSSIONEX PENNKINETIC ER) 10-8 MG/5ML SUER, benzonatate (TESSALON) 100 MG capsule  Viral URI with cough  Asthma exacerbation - Plan: benzonatate (TESSALON) 100 MG capsule  Trena Platt, PA-C [facility] 2/17/20177:24 PM

## 2016-01-28 NOTE — Patient Instructions (Signed)
Please take the albuterol inhaler every 4 hours for the next 24 hours, then use as needed.  You can use over the counter delsym for daytime cough, if you do not want to pick up the tessalon pearls.   Please back off of the tobacco use at this time.   I would like you to let me know if you develop productive cough during the day and fevers as soon as possible.   Please let me know if your symptoms do not improve within the next 4 days.   Asthma, Acute Bronchospasm Acute bronchospasm caused by asthma is also referred to as an asthma attack. Bronchospasm means your air passages become narrowed. The narrowing is caused by inflammation and tightening of the muscles in the air tubes (bronchi) in your lungs. This can make it hard to breathe or cause you to wheeze and cough. CAUSES Possible triggers are:  Animal dander from the skin, hair, or feathers of animals.  Dust mites contained in house dust.  Cockroaches.  Pollen from trees or grass.  Mold.  Cigarette or tobacco smoke.  Air pollutants such as dust, household cleaners, hair sprays, aerosol sprays, paint fumes, strong chemicals, or strong odors.  Cold air or weather changes. Cold air may trigger inflammation. Winds increase molds and pollens in the air.  Strong emotions such as crying or laughing hard.  Stress.  Certain medicines such as aspirin or beta-blockers.  Sulfites in foods and drinks, such as dried fruits and wine.  Infections or inflammatory conditions, such as a flu, cold, or inflammation of the nasal membranes (rhinitis).  Gastroesophageal reflux disease (GERD). GERD is a condition where stomach acid backs up into your esophagus.  Exercise or strenuous activity. SIGNS AND SYMPTOMS   Wheezing.  Excessive coughing, particularly at night.  Chest tightness.  Shortness of breath. DIAGNOSIS  Your health care provider will ask you about your medical history and perform a physical exam. A chest X-ray or blood  testing may be performed to look for other causes of your symptoms or other conditions that may have triggered your asthma attack. TREATMENT  Treatment is aimed at reducing inflammation and opening up the airways in your lungs. Most asthma attacks are treated with inhaled medicines. These include quick relief or rescue medicines (such as bronchodilators) and controller medicines (such as inhaled corticosteroids). These medicines are sometimes given through an inhaler or a nebulizer. Systemic steroid medicine taken by mouth or given through an IV tube also can be used to reduce the inflammation when an attack is moderate or severe. Antibiotic medicines are only used if a bacterial infection is present.  HOME CARE INSTRUCTIONS   Rest.  Drink plenty of liquids. This helps the mucus to remain thin and be easily coughed up. Only use caffeine in moderation and do not use alcohol until you have recovered from your illness.  Do not smoke. Avoid being exposed to secondhand smoke.  You play a critical role in keeping yourself in good health. Avoid exposure to things that cause you to wheeze or to have breathing problems.  Keep your medicines up-to-date and available. Carefully follow your health care provider's treatment plan.  Take your medicine exactly as prescribed.  When pollen or pollution is bad, keep windows closed and use an air conditioner or go to places with air conditioning.  Asthma requires careful medical care. See your health care provider for a follow-up as advised. If you are more than [redacted] weeks pregnant and you were prescribed any  new medicines, let your obstetrician know about the visit and how you are doing. Follow up with your health care provider as directed.  After you have recovered from your asthma attack, make an appointment with your outpatient doctor to talk about ways to reduce the likelihood of future attacks. If you do not have a doctor who manages your asthma, make an  appointment with a primary care doctor to discuss your asthma. SEEK IMMEDIATE MEDICAL CARE IF:   You are getting worse.  You have trouble breathing. If severe, call your local emergency services (911 in the [place]  You develop chest pain or discomfort.  You are vomiting.  You are not able to keep fluids down.  You are coughing up yellow, green, brown, or bloody sputum.  You have a fever and your symptoms suddenly get worse.  You have trouble swallowing. MAKE SURE YOU:   Understand these instructions.  Will watch your condition.  Will get help right away if you are not doing well or get worse.   This information is not intended to replace advice given to you by your health care provider. Make sure you discuss any questions you have with your health care provider.   Document Released: 03/14/2007 Document Revised: 12/02/2013 Document Reviewed: 06/04/2013 Elsevier Interactive Patient Education [employer]

## 2016-02-07 ENCOUNTER — Ambulatory Visit (INDEPENDENT_AMBULATORY_CARE_PROVIDER_SITE_OTHER): Payer: PRIVATE HEALTH INSURANCE | Admitting: Family Medicine

## 2016-02-07 VITALS — BP 130/80 | HR 92 | Temp 98.5°F | Resp 16 | Ht 59.0 in | Wt 153.8 lb

## 2016-02-07 DIAGNOSIS — R05 Cough: Secondary | ICD-10-CM | POA: Diagnosis not present

## 2016-02-07 DIAGNOSIS — J209 Acute bronchitis, unspecified: Secondary | ICD-10-CM

## 2016-02-07 DIAGNOSIS — R059 Cough, unspecified: Secondary | ICD-10-CM

## 2016-02-07 MED ORDER — HYDROCOD POLST-CPM POLST ER 10-8 MG/5ML PO SUER: 5.0000 mL | Freq: Every evening | ORAL | Status: DC | PRN

## 2016-02-07 MED ORDER — AZITHROMYCIN 250 MG PO TABS: ORAL_TABLET | ORAL | Status: DC

## 2016-02-07 NOTE — Patient Instructions (Signed)
Acute Bronchitis Bronchitis is inflammation of the airways that extend from the windpipe into the lungs (bronchi). The inflammation often causes mucus to develop. This leads to a cough, which is the most common symptom of bronchitis.  In acute bronchitis, the condition usually develops suddenly and goes away over time, usually in a couple weeks. Smoking, allergies, and asthma can make bronchitis worse. Repeated episodes of bronchitis may cause further lung problems.  CAUSES Acute bronchitis is most often caused by the same virus that causes a cold. The virus can spread from person to person (contagious) through coughing, sneezing, and touching contaminated objects. SIGNS AND SYMPTOMS   Cough.   Fever.   Coughing up mucus.   Body aches.   Chest congestion.   Chills.   Shortness of breath.   Sore throat.  DIAGNOSIS  Acute bronchitis is usually diagnosed through a physical exam. Your health care provider will also ask you questions about your medical history. Tests, such as chest X-rays, are sometimes done to rule out other conditions.  TREATMENT  Acute bronchitis usually goes away in a couple weeks. Oftentimes, no medical treatment is necessary. Medicines are sometimes given for relief of fever or cough. Antibiotic medicines are usually not needed but may be prescribed in certain situations. In some cases, an inhaler may be recommended to help reduce shortness of breath and control the cough. A cool mist vaporizer may also be used to help thin bronchial secretions and make it easier to clear the chest.  HOME CARE INSTRUCTIONS  Get plenty of rest.   Drink enough fluids to keep your urine clear or pale yellow (unless you have a medical condition that requires fluid restriction). Increasing fluids may help thin your respiratory secretions (sputum) and reduce chest congestion, and it will prevent dehydration.   Take medicines only as directed by your health care provider.  If  you were prescribed an antibiotic medicine, finish it all even if you start to feel better.  Avoid smoking and secondhand smoke. Exposure to cigarette smoke or irritating chemicals will make bronchitis worse. If you are a smoker, consider using nicotine gum or skin patches to help control withdrawal symptoms. Quitting smoking will help your lungs heal faster.   Reduce the chances of another bout of acute bronchitis by washing your hands frequently, avoiding people with cold symptoms, and trying not to touch your hands to your mouth, nose, or eyes.   Keep all follow-up visits as directed by your health care provider.  SEEK MEDICAL CARE IF: Your symptoms do not improve after 1 week of treatment.  SEEK IMMEDIATE MEDICAL CARE IF:  You develop an increased fever or chills.   You have chest pain.   You have severe shortness of breath.  You have bloody sputum.   You develop dehydration.  You faint or repeatedly feel like you are going to pass out.  You develop repeated vomiting.  You develop a severe headache. MAKE SURE YOU:   Understand these instructions.  Will watch your condition.  Will get help right away if you are not doing well or get worse.   This information is not intended to replace advice given to you by your health care provider. Make sure you discuss any questions you have with your health care provider.   Document Released: 01/04/2005 Document Revised: 12/18/2014 Document Reviewed: 05/20/2013 Elsevier Interactive Patient Education 2016 Elsevier Inc.  

## 2016-02-07 NOTE — Progress Notes (Signed)
Chief Complaint:  Chief Complaint  Patient presents with  . Chills    x 10 days  . Cough  . Nasal Congestion  . Wheezing    HPI: Jenny Montgomery is a 55 y.o. female who reports to [facility] today complaining of 10 day history of wheezing, SOB, chest congestion. Subjective fever .  She has ahd fever blisters in her snose.  She is coughing white sputum with yellow. No rashes , vomiting, diarrhea. She has hadchills.  Sinus pain and some ear pain .   SpO2 Readings from Last 3 Encounters:  02/07/16 98%  01/28/16 99%  09/06/15 99%     Past Medical History  Diagnosis Date  . Heart murmur   . Ulcer   . GERD (gastroesophageal reflux disease)    Past Surgical History  Procedure Laterality Date  . Wisdom tooth extraction    . Appendectomy    . Cesarean section      2 c sections  . Tubal ligation     Social History   Social History  . Marital Status: Married    Spouse Name: N/A  . Number of Children: N/A  . Years of Education: N/A   Social History Main Topics  . Smoking status: Current Every Day Smoker -- 0.50 packs/day for 30 years    Types: Cigarettes  . Smokeless tobacco: Never Used  . Alcohol Use: No  . Drug Use: No  . Sexual Activity: Not Asked   Other Topics Concern  . 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   Family History  Problem Relation Age of Onset  . Kidney disease Mother   . Hypertension Mother   . Anemia Mother   . Stroke Mother 32  . Cancer Father     throat cancer  . Hypertension Father   . Heart disease Maternal Grandmother   . Glaucoma Maternal Grandmother   . Diabetes Sister   . Heart attack Son    Allergies  Allergen Reactions  . Ibuprofen Other (See Comments)    ulcers   Prior to Admission medications   Medication Sig Start Date End Date Taking? Authorizing Provider    dextromethorphan-guaiFENesin (MUCINEX DM) 30-600 MG 12hr tablet Take 1 tablet by mouth 2 (two) times daily.   Yes Historical Provider, MD  albuterol (PROVENTIL HFA;VENTOLIN HFA) 108 (90 Base) MCG/ACT inhaler Inhale 2 puffs into the lungs every 4 (four) hours as needed for wheezing (cough, shortness of breath or wheezing.). Patient not taking: Reported on 02/07/2016 01/28/16   Collie Siad English, PA  benzonatate (TESSALON) 100 MG capsule Take 1-2 capsules (100-200 mg total) by mouth 3 (three) times daily as needed for cough. Patient not taking: Reported on 02/07/2016 01/28/16   Collie Siad English, PA  chlorpheniramine-HYDROcodone (TUSSIONEX PENNKINETIC ER) 10-8 MG/5ML SUER Take 5 mLs by mouth at bedtime as needed. Patient not taking: Reported on 02/07/2016 01/28/16   Collie Siad English, PA     ROS: The patient denies night sweats, unintentional weight loss, chest pain, palpitations, wheezing, dyspnea on exertion, nausea, vomiting, abdominal pain, dysuria, hematuria, melena, numbness, weakness, or tingling.   All other systems have been reviewed and were otherwise negative with the exception of those mentioned in the HPI and as above.    PHYSICAL EXAM:  Filed Vitals:   02/07/16 1116  BP: 130/80  Pulse: 92  Temp: 98.5 F (36.9 C)  Resp: 16   Body mass index is 31.05 kg/(m^2).   General: Alert, no acute distress HEENT:  Normocephalic, atraumatic, oropharynx patent. EOMI, PERRLA Erythematous throat, no exudates, TM normal, + sinus tenderness, + erythematous/boggy nasal mucosa Cardiovascular:  Regular rate and rhythm, no rubs murmurs or gallops.  No Carotid bruits, radial pulse intact. No pedal edema.  Respiratory: Clear to auscultation bilaterally.  No wheezes, rales, or rhonchi.  No cyanosis, no use of accessory musculature Abdominal: No organomegaly, abdomen is soft and non-tender, positive bowel sounds. No masses. Skin: No rashes. Neurologic: Facial musculature symmetric. Psychiatric:  Patient acts appropriately throughout our interaction. Lymphatic: No cervical or submandibular lymphadenopathy Musculoskeletal: Gait intact. No edema, tenderness   LABS: Results for orders placed or performed in visit on 12/08/14  Wound culture  Result Value Ref Range   Gram Stain Rare    Gram Stain WBC present-both PMN and Mononuclear    Gram Stain No Squamous Epithelial Cells Seen    Gram Stain No Organisms Seen    Organism ID, Bacteria NO GROWTH 2 DAYS      EKG/XRAY:   Primary read interpreted by Dr. Conley Rolls at [facility].   ASSESSMENT/PLAN: Encounter Diagnoses  Name Primary?  . Cough   . Acute bronchitis, unspecified organism Yes   Rx azithromycin Will return for chest xray prn  Refill tussionex Fu prn , cont with albuterol inha regular   Gross sideeffects, risk and benefits, and alternatives of medications d/w patient. Patient is aware that all medications have potential sideeffects and we are unable to predict every sideeffect or drug-drug interaction that may occur.  Thao Le DO  02/07/2016 1:06 PM

## 2016-02-17 IMAGING — CR DG CHEST 2V
2 series · 2 of 2 positions shown · non-contrast
Comparison: 10/07/2012

CLINICAL DATA: Cough, congestion and shortness of breath for 4
weeks, smoker

EXAM:
CHEST  2 VIEW

[PA]
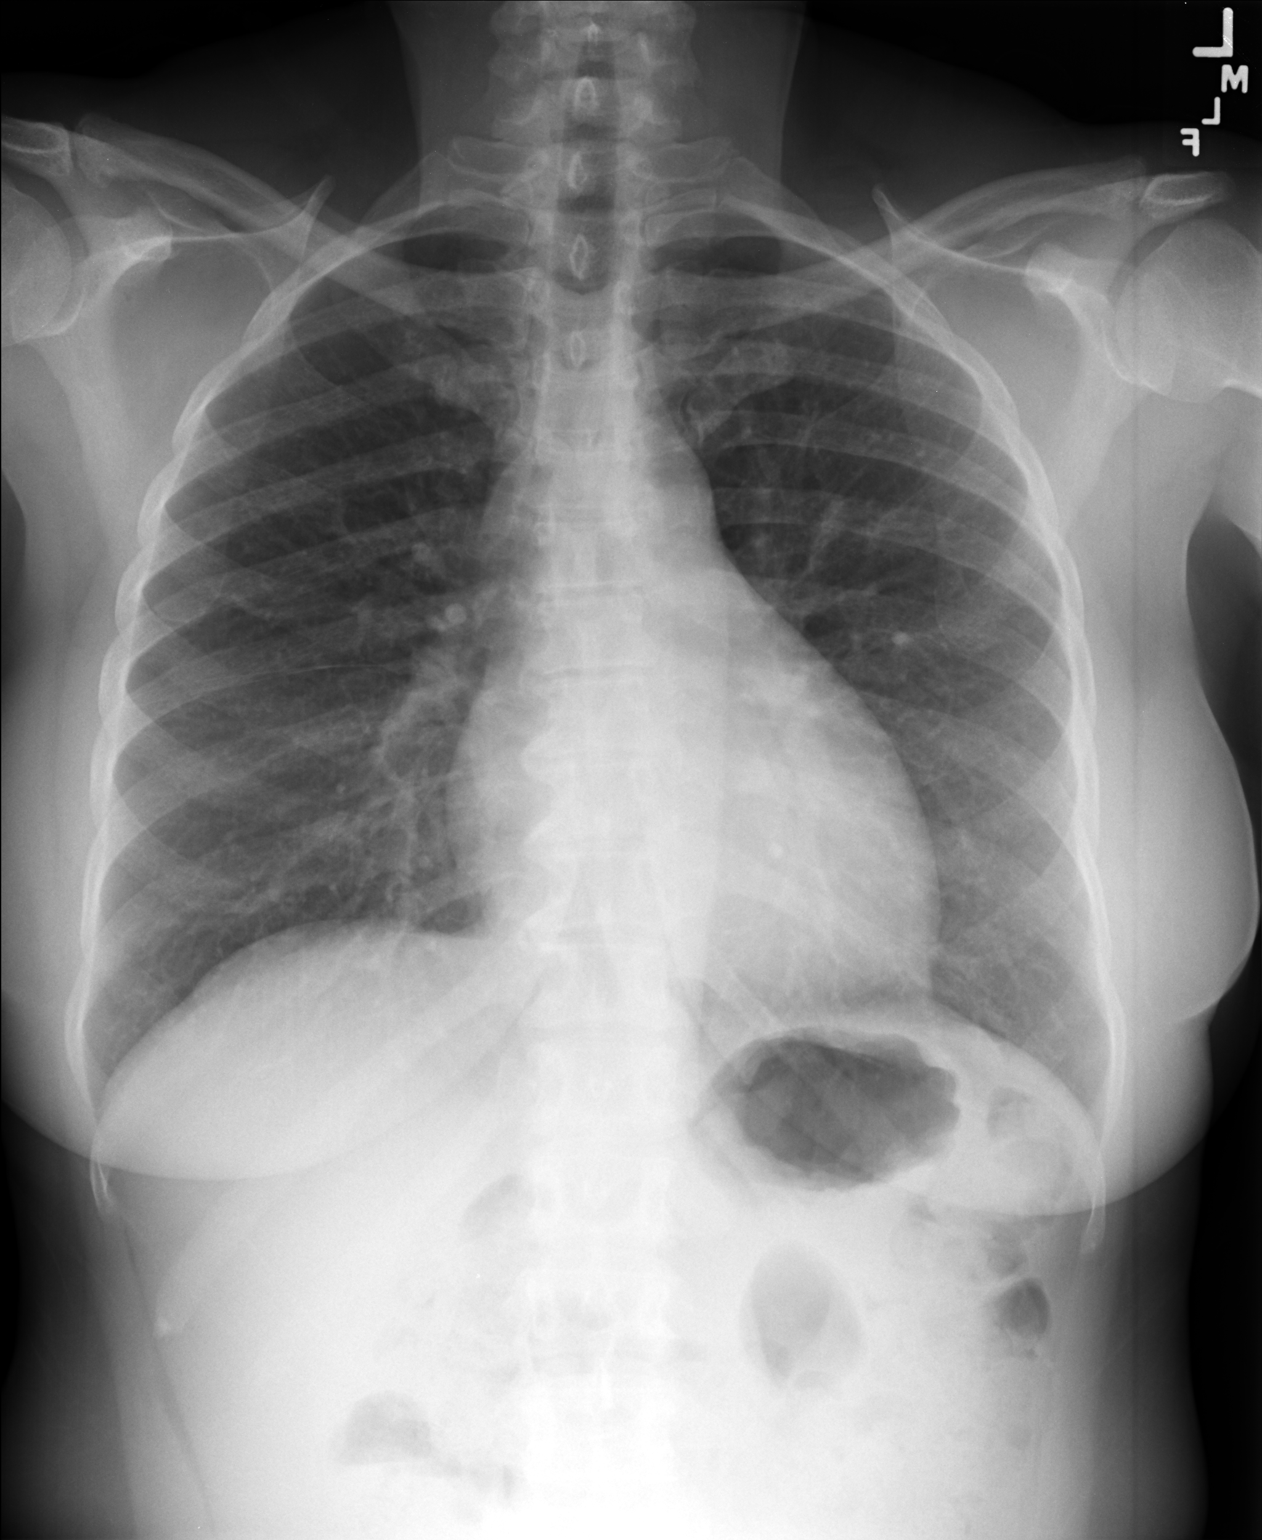

[lateral]
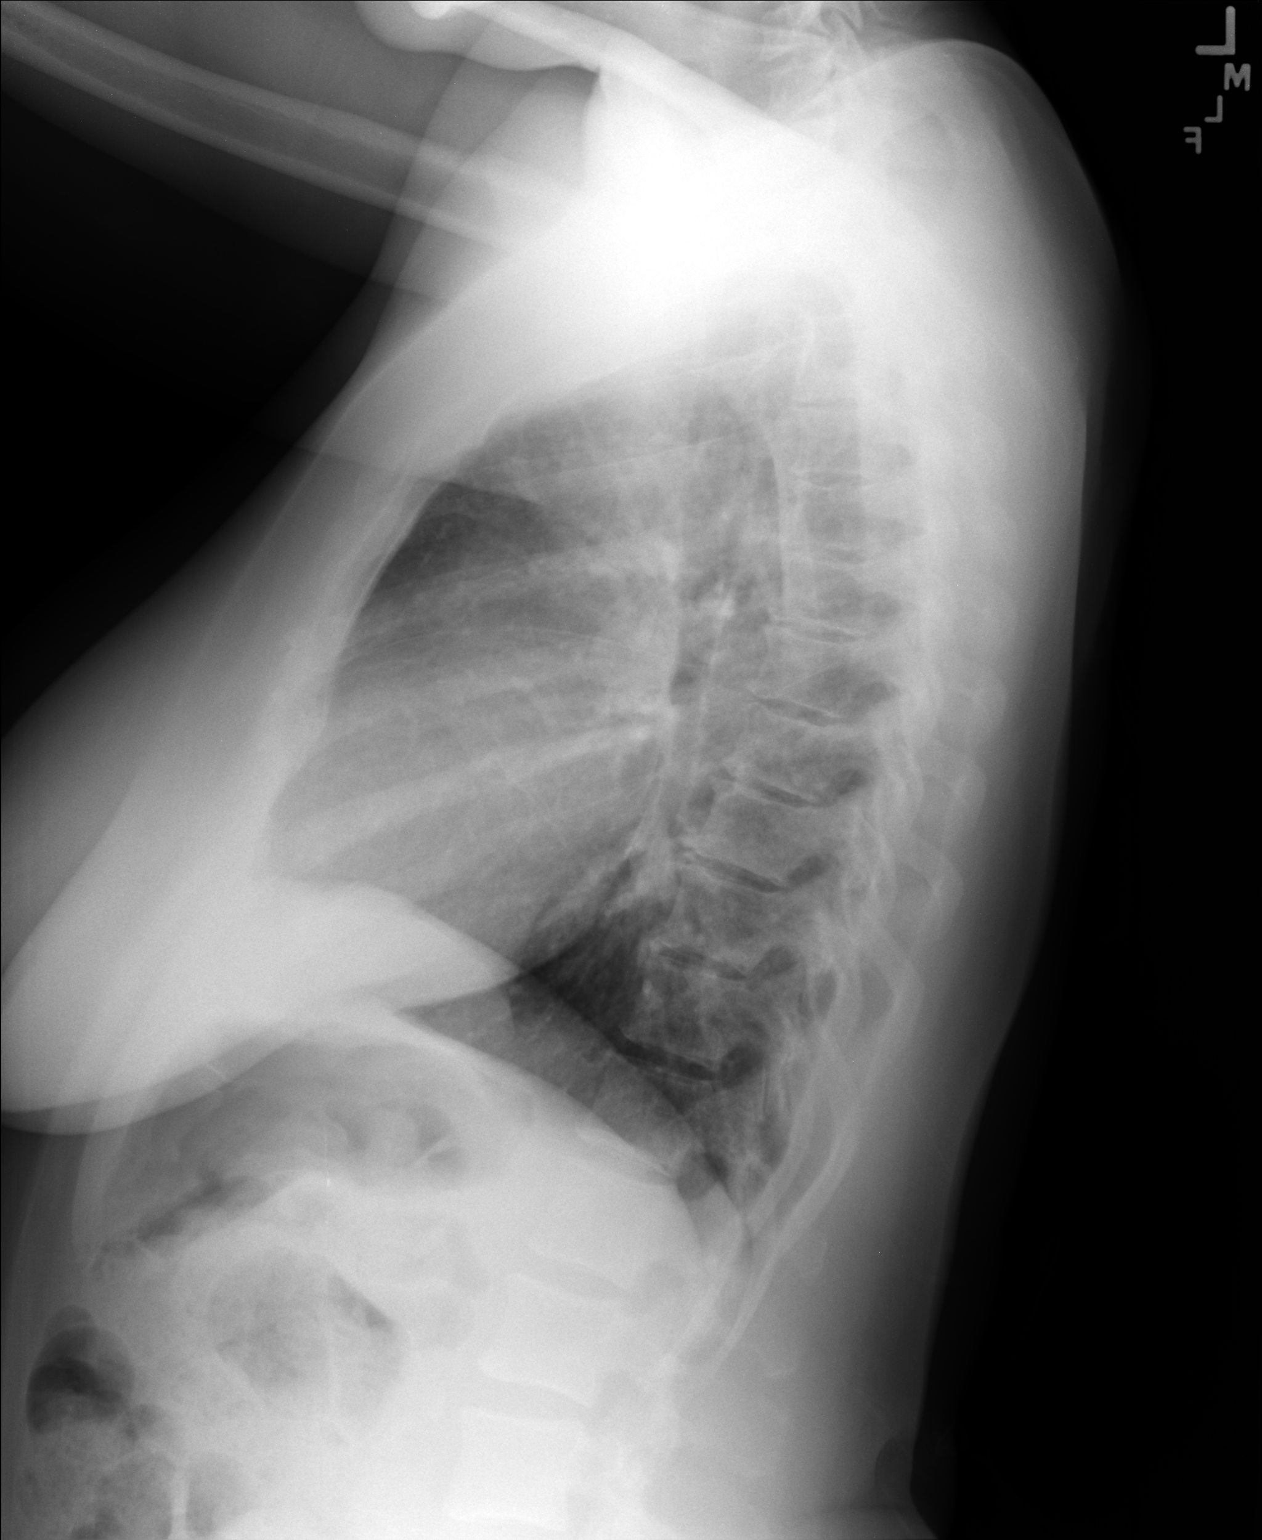

[2 of 2 positions shown; findings below may reference images not displayed]

FINDINGS: Borderline enlargement of cardiac silhouette.

Mediastinal contours and pulmonary vascularity normal.

Lungs clear.

No pleural effusion or pneumothorax.

Scattered endplate spur formation thoracic spine.
IMPRESSION: No acute abnormalities.

## 2017-01-09 ENCOUNTER — Ambulatory Visit (INDEPENDENT_AMBULATORY_CARE_PROVIDER_SITE_OTHER): Payer: PRIVATE HEALTH INSURANCE | Admitting: Physician Assistant

## 2017-01-09 VITALS — BP 140/76 | HR 105 | Temp 98.2°F | Resp 16 | Ht 59.0 in | Wt 158.0 lb

## 2017-01-09 DIAGNOSIS — R6889 Other general symptoms and signs: Secondary | ICD-10-CM | POA: Diagnosis not present

## 2017-01-09 MED ORDER — BENZONATATE 100 MG PO CAPS: 100.0000 mg | ORAL_CAPSULE | Freq: Three times a day (TID) | ORAL | 0 refills | Status: DC | PRN

## 2017-01-09 MED ORDER — GUAIFENESIN ER 1200 MG PO TB12: 1.0000 | ORAL_TABLET | Freq: Two times a day (BID) | ORAL | 1 refills | Status: DC | PRN

## 2017-01-09 MED ORDER — OSELTAMIVIR PHOSPHATE 75 MG PO CAPS: 75.0000 mg | ORAL_CAPSULE | Freq: Two times a day (BID) | ORAL | 0 refills | Status: DC

## 2017-01-09 MED ORDER — FLUTICASONE PROPIONATE 50 MCG/ACT NA SUSP: 2.0000 | Freq: Every day | NASAL | 0 refills | Status: DC

## 2017-01-09 MED ORDER — HYDROCOD POLST-CPM POLST ER 10-8 MG/5ML PO SUER: 5.0000 mL | Freq: Every evening | ORAL | 0 refills | Status: DC | PRN

## 2017-01-09 NOTE — Patient Instructions (Addendum)
Please continue to drink water, at least 3 regular sized water bottles per day. Use the tylenol for pain and fever every 6 hours. Please use the albuterol if to help with trouble breathing, but if you are having trouble breathing where you need it more often than every 4 hours for a day, you need to return. Please use the flonase for the nose to clear out the congestion.   Influenza, Adult Influenza ("the flu") is an infection in the lungs, nose, and throat (respiratory tract). It is caused by a virus. The flu causes many common cold symptoms, as well as a high fever and body aches. It can make you feel very sick. The flu spreads easily from person to person (is contagious). Getting a flu shot (influenza vaccination) every year is the best way to prevent the flu. Follow these instructions at home:  Take over-the-counter and prescription medicines only as told by your doctor.  Use a cool mist humidifier to add moisture (humidity) to the air in your home. This can make it easier to breathe.  Rest as needed.  Drink enough fluid to keep your pee (urine) clear or pale yellow.  Cover your mouth and nose when you cough or sneeze.  Wash your hands with soap and water often, especially after you cough or sneeze. If you cannot use soap and water, use hand sanitizer.  Stay home from work or school as told by your doctor. Unless you are visiting your doctor, try to avoid leaving home until your fever has been gone for 24 hours without the use of medicine.  Keep all follow-up visits as told by your doctor. This is important. How is this prevented?  Getting a yearly (annual) flu shot is the best way to avoid getting the flu. You may get the flu shot in late summer, fall, or winter. Ask your doctor when you should get your flu shot.  Wash your hands often or use hand sanitizer often.  Avoid contact with people who are sick during cold and flu season.  Eat healthy foods.  Drink plenty of  fluids.  Get enough sleep.  Exercise regularly. Contact a doctor if:  You get new symptoms.  You have:  Chest pain.  Watery poop (diarrhea).  A fever.  Your cough gets worse.  You start to have more mucus.  You feel sick to your stomach (nauseous).  You throw up (vomit). Get help right away if:  You start to be short of breath or have trouble breathing.  Your skin or nails turn a bluish color.  You have very bad pain or stiffness in your neck.  You get a sudden headache.  You get sudden pain in your face or ear.  You cannot stop throwing up. This information is not intended to replace advice given to you by your health care provider. Make sure you discuss any questions you have with your health care provider. Document Released: 09/05/2008 Document Revised: 05/04/2016 Document Reviewed: 09/21/2015 Elsevier Interactive Patient Education  2017 [employer]    IF you received an x-ray today, you will receive an invoice from [facility]. Please contact [facility] at [phone] with questions or concerns regarding your invoice.   IF you received labwork today, you will receive an invoice from [facility]. Please contact [facility] at [phone] with questions or concerns regarding your invoice.   Our billing staff will not be able to assist you with questions regarding bills from these companies.  You will be contacted  with the lab results as soon as they are available. The fastest way to get your results is to activate your My Chart account. Instructions are located on the last page of this paperwork. If you have not heard from us regarding the results in 2 weeks, please contact this office.     

## 2017-01-09 NOTE — Progress Notes (Signed)
[facility] [address], [place] [id number] [phone]  Date:  01/09/2017   Name:  Jenny LatheCathy D Montgomery   DOB:  11/20/1961   MRN:  [id number]  PCP:  Abbe AmsterdamOPLAND,JESSICA, MD   Chief Complaint  Patient presents with  . Flu like symptoms   History of Present Illness:  Jenny LatheCathy D Montgomery is a 56 y.o. female patient who [facility] complaint of fever, cough, and bodyaches.    2 days ago, woke up with weakness and body aches.  She slept most of the day.  She has coughing.  Subjective fever and chills.  Throat and ear pian.  Nasal congestion.  She feels a little short of breath, aggravated with wind.  She took alkaseltzer cough syrup which did not help, yesterday.  She has not been around anyone ill.    There are no active problems to display for this patient.   Past Medical History:  Diagnosis Date  . GERD (gastroesophageal reflux disease)   . Heart murmur   . Ulcer [facility](HCC)     Past Surgical History:  Procedure Laterality Date  . APPENDECTOMY    . CESAREAN SECTION     2 c sections  . TUBAL LIGATION    . WISDOM TOOTH EXTRACTION      Social History  Substance Use Topics  . Smoking status: Current Every Day Smoker    Packs/day: 0.50    Years: 30.00    Types: Cigarettes  . Smokeless tobacco: Never Used  . Alcohol use No    Family History  Problem Relation Age of Onset  . Kidney disease Mother   . Hypertension Mother   . Anemia Mother   . Stroke Mother [id number]  . Cancer Father     throat cancer  . Hypertension Father   . Heart disease Maternal Grandmother   . Glaucoma Maternal Grandmother   . Diabetes Sister   . Heart attack Son     Allergies  Allergen Reactions  . Ibuprofen Other (See Comments)    ulcers    Medication list has been reviewed and updated.  Current Outpatient Prescriptions on File Prior to Visit  Medication Sig Dispense Refill  . albuterol (PROVENTIL HFA;VENTOLIN HFA) 108 (90 Base) MCG/ACT inhaler Inhale 2 puffs into  the lungs every 4 (four) hours as needed for wheezing (cough, shortness of breath or wheezing.). (Patient not taking: Reported on 02/07/2016) 1 Inhaler 1  . chlorpheniramine-HYDROcodone (TUSSIONEX PENNKINETIC ER) 10-8 MG/5ML SUER Take 5 mLs by mouth at bedtime as needed. (Patient not taking: Reported on 01/09/2017) 100 mL 0   No current facility-administered medications on file prior to visit.     ROS   Physical Examination: BP 140/76   Pulse (!) 105   Temp 98.2 F (36.8 C) (Oral)   Resp 16   Ht 4\' 11"  (1.499 m)   Wt 158 lb (71.7 kg)   SpO2 99%   BMI 31.91 kg/m  Ideal Body Weight: Weight in (lb) to have BMI = 25: 123.5  Physical Exam  Constitutional: She is oriented to person, place, and time. She appears well-developed and well-nourished. No distress.  HENT:  Head: Normocephalic and atraumatic.  Right Ear: Tympanic membrane, external ear and ear canal normal.  Left Ear: Tympanic membrane, external ear and ear canal normal.  Nose: Mucosal edema and rhinorrhea present. Right sinus exhibits no maxillary sinus tenderness and no frontal sinus tenderness. Left sinus exhibits no maxillary sinus tenderness and no  frontal sinus tenderness.  Mouth/Throat: No uvula swelling. No oropharyngeal exudate, posterior oropharyngeal edema or posterior oropharyngeal erythema.  Eyes: Conjunctivae and EOM are normal. Pupils are equal, round, and reactive to light.  Cardiovascular: Normal rate and regular rhythm.  Exam reveals no gallop, no distant heart sounds and no friction rub.   No murmur heard. Pulmonary/Chest: Effort normal. No respiratory distress. She has no decreased breath sounds. She has no wheezes. She has no rhonchi.  Lymphadenopathy:       Head (right side): No submandibular, no tonsillar, no preauricular and no posterior auricular adenopathy present.       Head (left side): No submandibular, no tonsillar, no preauricular and no posterior auricular adenopathy present.  Neurological: She is  alert and oriented to person, place, and time.  Skin: She is not diaphoretic.  Psychiatric: She has a normal mood and affect. Her behavior is normal.     Assessment and Plan: Jenny Montgomery is a 56 y.o. female who is here today for cc of flu-like symptoms.   Treating supportively.  Advised alarming sxs to warrant immediate return.   Flu-like symptoms - Plan: oseltamivir (TAMIFLU) 75 MG capsule, Guaifenesin (MUCINEX MAXIMUM STRENGTH) 1200 MG TB12, chlorpheniramine-HYDROcodone (TUSSIONEX PENNKINETIC ER) 10-8 MG/5ML SUER, benzonatate (TESSALON) 100 MG capsule  Trena Platt, PA-C [facility] 2/2/20187:27 AM

## 2018-03-19 ENCOUNTER — Ambulatory Visit: Payer: PRIVATE HEALTH INSURANCE | Admitting: Emergency Medicine

## 2018-03-19 ENCOUNTER — Other Ambulatory Visit: Payer: Self-pay

## 2018-03-19 ENCOUNTER — Encounter: Payer: Self-pay | Admitting: Emergency Medicine

## 2018-03-19 VITALS — BP 152/82 | HR 95 | Temp 98.6°F | Resp 16 | Ht 59.25 in | Wt 162.4 lb

## 2018-03-19 DIAGNOSIS — Z23 Encounter for immunization: Secondary | ICD-10-CM | POA: Diagnosis not present

## 2018-03-19 DIAGNOSIS — T23261A Burn of second degree of back of right hand, initial encounter: Secondary | ICD-10-CM | POA: Diagnosis not present

## 2018-03-19 MED ORDER — SILVER SULFADIAZINE 1 % EX CREA
TOPICAL_CREAM | Freq: Every day | CUTANEOUS | Status: DC
  Administered 2018-03-19: 16:00:00 via TOPICAL

## 2018-03-19 MED ORDER — SILVER SULFADIAZINE 1 % EX CREA: 1.0000 "application " | TOPICAL_CREAM | Freq: Every day | CUTANEOUS | 0 refills | Status: AC
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
## 2018-03-19 NOTE — Patient Instructions (Addendum)
   IF you received an x-ray today, you will receive an invoice from [facility]. Please contact [facility] at [phone] with questions or concerns regarding your invoice.   IF you received labwork today, you will receive an invoice from [facility]. Please contact [facility] at [phone] with questions or concerns regarding your invoice.   Our billing staff will not be able to assist you with questions regarding bills from these companies.  You will be contacted with the lab results as soon as they are available. The fastest way to get your results is to activate your My Chart account. Instructions are located on the last page of this paperwork. If you have not heard from us regarding the results in 2 weeks, please contact this office.     Burn Care, Adult A burn is an injury to the skin or the tissues under the skin. There are three types of burns:  First degree. These burns may cause the skin to be red and a bit swollen.  Second degree. These burns are very painful and cause the skin to be very red. The skin may also leak fluid, look shiny, and start to have blisters.  Third degree. These burns cause permanent damage. They turn the skin white or black and make it look charred, dry, and leathery.  Taking care of your burn properly can help to prevent pain and infection. It can also help the burn to heal more quickly. How is this treated? Right after a burn:  Rinse or soak the burn under cool water. Do this for several minutes. Do not put ice on your burn. That can cause more damage.  Lightly cover the burn with a clean (sterile) cloth (dressing). Burn care  Raise (elevate) the injured area above the level of your heart while sitting or lying down.  Follow instructions from your doctor about: ? How to clean and take care of the burn. ? When to change and remove the cloth.  Check your burn every day for signs of infection. Check for: ? More redness,  swelling, or pain. ? Warmth. ? Pus or a bad smell. Medicine   Take over-the-counter and prescription medicines only as told by your doctor.  If you were prescribed antibiotic medicine, take or apply it as told by your doctor. Do not stop using the antibiotic even if your condition improves. General instructions  To prevent infection: ? Do not put butter, oil, or other home treatments on the burn. ? Do not scratch or pick at the burn. ? Do not break any blisters. ? Do not peel skin.  Do not rub your burn, even when you are cleaning it.  Protect your burn from the sun. Contact a doctor if:  Your condition does not get better.  Your condition gets worse.  You have a fever.  Your burn looks different or starts to have black or red spots on it.  Your burn feels warm to the touch.  Your pain is not controlled with medicine. Get help right away if:  You have redness, swelling, or pain at the site of the burn.  You have fluid, blood, or pus coming from your burn.  You have red streaks near the burn.  You have very bad pain. This information is not intended to replace advice given to you by your health care provider. Make sure you discuss any questions you have with your health care provider. Document Released: 09/05/2008 Document Revised: 01/13/2017 Document Reviewed: 05/16/2016 Elsevier Interactive   Patient Education  2018 Elsevier Inc.  

## 2018-08-02 ENCOUNTER — Ambulatory Visit (INDEPENDENT_AMBULATORY_CARE_PROVIDER_SITE_OTHER): Payer: PRIVATE HEALTH INSURANCE | Admitting: Emergency Medicine

## 2018-08-02 ENCOUNTER — Other Ambulatory Visit: Payer: Self-pay

## 2018-08-02 ENCOUNTER — Encounter: Payer: Self-pay | Admitting: Emergency Medicine

## 2018-08-02 VITALS — BP 156/92 | HR 93 | Temp 98.5°F | Resp 16 | Ht 59.25 in | Wt 159.2 lb

## 2018-08-02 DIAGNOSIS — M545 Low back pain, unspecified: Secondary | ICD-10-CM | POA: Insufficient documentation

## 2018-08-02 DIAGNOSIS — M62838 Other muscle spasm: Secondary | ICD-10-CM

## 2018-08-02 DIAGNOSIS — M549 Dorsalgia, unspecified: Secondary | ICD-10-CM

## 2018-08-02 MED ORDER — DICLOFENAC SODIUM 75 MG PO TBEC
75.0000 mg | DELAYED_RELEASE_TABLET | Freq: Two times a day (BID) | ORAL | 0 refills | Status: AC
Start: 2018-08-02 — End: 2018-08-07

## 2018-08-02 MED ORDER — CYCLOBENZAPRINE HCL 10 MG PO TABS
10.0000 mg | ORAL_TABLET | Freq: Every day | ORAL | 0 refills | Status: DC
Start: 2018-08-02 — End: 2018-08-19

## 2018-08-02 NOTE — Patient Instructions (Addendum)
   If you have lab work done today you will be contacted with your lab results within the next 2 weeks.  If you have not heard from us then please contact us. The fastest way to get your results is to register for My Chart.   IF you received an x-ray today, you will receive an invoice from [facility]. Please contact [facility] at [phone] with questions or concerns regarding your invoice.   IF you received labwork today, you will receive an invoice from [facility]. Please contact [facility] at [phone] with questions or concerns regarding your invoice.   Our billing staff will not be able to assist you with questions regarding bills from these companies.  You will be contacted with the lab results as soon as they are available. The fastest way to get your results is to activate your My Chart account. Instructions are located on the last page of this paperwork. If you have not heard from us regarding the results in 2 weeks, please contact this office.     Back Pain, Adult Back pain is very common. The pain often gets better over time. The cause of back pain is usually not dangerous. Most people can learn to manage their back pain on their own. Follow these instructions at home: Watch your back pain for any changes. The following actions may help to lessen any pain you are feeling:  Stay active. Start with short walks on flat ground if you can. Try to walk farther each day.  Exercise regularly as told by your doctor. Exercise helps your back heal faster. It also helps avoid future injury by keeping your muscles strong and flexible.  Do not sit, drive, or stand in one place for more than 30 minutes.  Do not stay in bed. Resting more than 1-2 days can slow down your recovery.  Be careful when you bend or lift an object. Use good form when lifting: ? Bend at your knees. ? Keep the object close to your body. ? Do not twist.  Sleep on a firm mattress. Lie on your  side, and bend your knees. If you lie on your back, put a pillow under your knees.  Take medicines only as told by your doctor.  Put ice on the injured area. ? Put ice in a plastic bag. ? Place a towel between your skin and the bag. ? Leave the ice on for 20 minutes, 2-3 times a day for the first 2-3 days. After that, you can switch between ice and heat packs.  Avoid feeling anxious or stressed. Find good ways to deal with stress, such as exercise.  Maintain a healthy weight. Extra weight puts stress on your back.  Contact a doctor if:  You have pain that does not go away with rest or medicine.  You have worsening pain that goes down into your legs or buttocks.  You have pain that does not get better in one week.  You have pain at night.  You lose weight.  You have a fever or chills. Get help right away if:  You cannot control when you poop (bowel movement) or pee (urinate).  Your arms or legs feel weak.  Your arms or legs lose feeling (numbness).  You feel sick to your stomach (nauseous) or throw up (vomit).  You have belly (abdominal) pain.  You feel like you may pass out (faint). This information is not intended to replace advice given to you by your health care   provider. Make sure you discuss any questions you have with your health care provider. Document Released: 05/15/2008 Document Revised: 05/04/2016 Document Reviewed: 03/31/2014 Elsevier Interactive Patient Education  2018 [employer]vier Inc.  
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
## 2018-08-19 ENCOUNTER — Encounter: Payer: Self-pay | Admitting: Podiatry

## 2018-08-19 ENCOUNTER — Ambulatory Visit (INDEPENDENT_AMBULATORY_CARE_PROVIDER_SITE_OTHER): Payer: PRIVATE HEALTH INSURANCE

## 2018-08-19 ENCOUNTER — Ambulatory Visit: Payer: PRIVATE HEALTH INSURANCE | Admitting: Podiatry

## 2018-08-19 VITALS — BP 185/99 | HR 96 | Resp 16

## 2018-08-19 DIAGNOSIS — M21622 Bunionette of left foot: Secondary | ICD-10-CM

## 2018-08-19 DIAGNOSIS — M7751 Other enthesopathy of right foot: Secondary | ICD-10-CM

## 2018-08-19 DIAGNOSIS — L989 Disorder of the skin and subcutaneous tissue, unspecified: Secondary | ICD-10-CM | POA: Diagnosis not present

## 2018-08-19 DIAGNOSIS — M21621 Bunionette of right foot: Secondary | ICD-10-CM | POA: Diagnosis not present

## 2018-08-19 DIAGNOSIS — M7752 Other enthesopathy of left foot: Secondary | ICD-10-CM

## 2018-08-19 DIAGNOSIS — M2041 Other hammer toe(s) (acquired), right foot: Secondary | ICD-10-CM | POA: Diagnosis not present

## 2018-08-19 DIAGNOSIS — M779 Enthesopathy, unspecified: Principal | ICD-10-CM

## 2018-08-19 DIAGNOSIS — M778 Other enthesopathies, not elsewhere classified: Secondary | ICD-10-CM

## 2018-08-19 NOTE — Patient Instructions (Signed)
Pre-Operative Instructions  Congratulations, you have decided to take an important step towards improving your quality of life.  You can be assured that the doctors and staff at [facility] will be with you every step of the way.  Here are some important things you should know:  1. Plan to be at the surgery center/hospital at least 1 (one) hour prior to your scheduled time, unless otherwise directed by the surgical center/hospital staff.  You must have a responsible adult accompany you, remain during the surgery and drive you home.  Make sure you have directions to the surgical center/hospital to ensure you arrive on time. 2. If you are having surgery at [facility] or [facility] hospitals, you will need a copy of your medical history and physical form from your family physician within one month prior to the date of surgery. We will give you a form for your primary physician to complete.  3. We make every effort to accommodate the date you request for surgery.  However, there are times where surgery dates or times have to be moved.  We will contact you as soon as possible if a change in schedule is required.   4. No aspirin/ibuprofen for one week before surgery.  If you are on aspirin, any non-steroidal anti-inflammatory medications (Mobic, Aleve, Ibuprofen) should not be taken seven (7) days prior to your surgery.  You make take Tylenol for pain prior to surgery.  5. Medications - If you are taking daily heart and blood pressure medications, seizure, reflux, allergy, asthma, anxiety, pain or diabetes medications, make sure you notify the surgery center/hospital before the day of surgery so they can tell you which medications you should take or avoid the day of surgery. 6. No food or drink after midnight the night before surgery unless directed otherwise by surgical center/hospital staff. 7. No alcoholic beverages 24-hours prior to surgery.  No smoking 24-hours prior or 24-hours after  surgery. 8. Wear loose pants or shorts. They should be loose enough to fit over bandages, boots, and casts. 9. Don't wear slip-on shoes. Sneakers are preferred. 10. Bring your boot with you to the surgery center/hospital.  Also bring crutches or a walker if your physician has prescribed it for you.  If you do not have this equipment, it will be provided for you after surgery. 11. If you have not been contacted by the surgery center/hospital by the day before your surgery, call to confirm the date and time of your surgery. 12. Leave-time from work may vary depending on the type of surgery you have.  Appropriate arrangements should be made prior to surgery with your employer. 13. Prescriptions will be provided immediately following surgery by your doctor.  Fill these as soon as possible after surgery and take the medication as directed. Pain medications will not be refilled on weekends and must be approved by the doctor. 14. Remove nail polish on the operative foot and avoid getting pedicures prior to surgery. 15. Wash the night before surgery.  The night before surgery wash the foot and leg well with water and the antibacterial soap provided. Be sure to pay special attention to beneath the toenails and in between the toes.  Wash for at least three (3) minutes. Rinse thoroughly with water and dry well with a towel.  Perform this wash unless told not to do so by your physician.  Enclosed: 1 Ice pack (please put in freezer the night before surgery)   1 Hibiclens skin cleaner     Pre-op instructions  If you have any questions regarding the instructions, please do not hesitate to call our office.  [place]: [address], , [place] [address] -- [phone]  [place]: [address] , [place] [address] -- [phone]  [place]: [address]  [place], [place] [address] -- [phone]  [place]: [address], [address], [place],  [address] -- [phone]  Website:  [email or web address] 

## 2018-08-21 ENCOUNTER — Telehealth: Payer: Self-pay | Admitting: *Deleted

## 2018-08-21 NOTE — Progress Notes (Signed)
   HPI: 57 year old female presenting today as a new patient with a chief complaint of aching pain to the bilateral 5th MPJs, left greater than right that has been present for the past few years. She reports associated callus lesions to the areas. Walking increases the pain. She has received an injection from Dr. Elijah Birk for treatment was told she needed surgery. Patient is here for further evaluation and treatment.   Past Medical History:  Diagnosis Date  . GERD (gastroesophageal reflux disease)   . Heart murmur   . Ulcer       Objective: Physical Exam General: The patient is alert and oriented x3 in no acute distress.  Dermatology: Skin is cool, dry and supple bilateral lower extremities. Negative for open lesions or macerations. Hyperkeratotic lesions present on the sub-fifth MPJs of bilateral feet. Pain on palpation with a central nucleated core noted. The medial border of the right fourth toe appears to be erythematous with evidence of an ingrowing nail. Pain on palpation noted to the border of the nail fold.   Vascular: Palpable pedal pulses bilaterally. No edema or erythema noted. Capillary refill within normal limits.  Neurological: Epicritic and protective threshold grossly intact bilaterally.   Musculoskeletal Exam: All pedal and ankle joints range of motion within normal limits bilateral. Muscle strength 5/5 in all groups bilateral. Hammertoe contracture deformity noted to fourth digit of the right foot. Clinical evidence of Tailor's bunion deformity noted to the respective foot. There is a moderate pain on palpation range of motion of the fifth MPJ.   Radiographic Exam: Hammertoe contracture deformity noted to the interphalangeal joints and MPJ of the respective hammertoe digits mentioned on clinical musculoskeletal exam. Increased intermetatarsal angle to the fourth interspace of the respective foot. Prominent fifth metatarsal head. Joint spaces preserved.   Assessment: 1.  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 Hammertoe fourth digit right 3. Tailor's bunion bilateral 4. Paronychia with ingrowing nail medial border right hallux 5. Incurvated nail  Plan of Care:  1. Patient evaluated. X-Rays reviewed.  2. Today we discussed the conservative versus surgical management of the presenting pathology. The patient opts for surgical management. All possible complications and details of the procedure were explained. All patient questions were answered. No guarantees were expressed or implied. 3. Authorization for surgery was initiated today. Surgery will consist of Tailor's bunionectomy with metatarsal osteotomy bilateral, transverse PIPJ arthroplasty 4th right toe.  4. Post op shoes dispensed bilaterally.  5. Excisional debridement of keratoic lesion using a chisel blade was performed without incident. Light dressing applied.  6. We will address ingrown during post op recovery after surgery.  7. Return to clinic one week post op.     Felecia Shelling, DPM [facility]  Dr. Felecia Shelling, DPM    [address] [place], [place] [address]                Office [phone]  Fax [phone]

## 2018-08-21 NOTE — Telephone Encounter (Signed)
"  I need to reschedule my surgery with Dr. Logan Bores.  I'm scheduled for October 31.  I want to change it to November 14."  I don't have you scheduled for October 31, I have you scheduled for October 3.  "See, I'm all mixed up.  I still want to switch it to November 14.  Can I come by to get a note stating when my surgery is going to be?"  Yes, I will have it ready for you.  "I'll come by tomorrow to pick it up because I'll be off work."  The letter was typed and placed upfront for her to pick up.

## 2018-10-22 DIAGNOSIS — M79676 Pain in unspecified toe(s): Secondary | ICD-10-CM

## 2018-10-24 ENCOUNTER — Ambulatory Visit (HOSPITAL_COMMUNITY)
Admission: EM | Admit: 2018-10-24 | Discharge: 2018-10-24 | Disposition: A | Discharge status: Home / Self Care | Payer: No Typology Code available for payment source | Attending: Family Medicine | Admitting: Family Medicine

## 2018-10-24 ENCOUNTER — Encounter (HOSPITAL_COMMUNITY): Payer: Self-pay | Admitting: Emergency Medicine

## 2018-10-24 ENCOUNTER — Encounter: Payer: Self-pay | Admitting: Podiatry

## 2018-10-24 ENCOUNTER — Other Ambulatory Visit: Payer: Self-pay

## 2018-10-24 DIAGNOSIS — I10 Essential (primary) hypertension: Secondary | ICD-10-CM

## 2018-10-24 HISTORY — DX: Essential (primary) hypertension: I10

## 2018-10-24 MED ORDER — AMLODIPINE BESYLATE 5 MG PO TABS: 5.0000 mg | ORAL_TABLET | Freq: Every day | ORAL | 0 refills | Status: DC

## 2018-10-24 NOTE — ED Provider Notes (Signed)
[facility]   [id number] 10/24/18 Arrival Time: 1308  ASSESSMENT & PLAN:  1. Hypertension, uncontrolled    Elects to initiate therapy. Meds ordered this encounter  Medications  . amLODipine (NORVASC) 5 MG tablet    Sig: Take 1 tablet (5 mg total) by mouth daily.    Dispense:  30 tablet    Refill:  0    Follow-up Information    [facility] In 1 week.   Specialty:  Urgent Care Why:  For blood pressure recheck. Contact information: [address] [place] [id number] [phone]         Encouraged her to promptly establish care with a PCP for regular follow up.  Reviewed expectations re: course of current medical issues. Questions answered. Outlined signs and symptoms indicating need for more acute intervention. Patient verbalized understanding. After Visit Summary given.   SUBJECTIVE:  Jenny Montgomery is a 57 y.o. female who presents with concerns regarding increased blood pressures. Was scheduled to have foot surgery this morning but BP was reported at 215/110. Surgery cancelled. She does not have a PCP that she sees regularly. Has been told BP elevated in the past. No treatment. Would like to discuss initiation of treatment. Denies symptoms of chest pain, regular headaches, vision changes, palpations, orthopnea, nocturnal dyspnea, or LE edema.  Social History   Tobacco Use  Smoking Status Current Every Day Smoker  . Packs/day: 0.50  . Years: 30.00  . Pack years: 15.00  . Types: Cigarettes  Smokeless Tobacco Never Used    ROS: As per HPI.   OBJECTIVE:  Vitals:   10/24/18 1423  BP: (!) 188/85  Pulse: 89  Resp: 20  Temp: 97.8 F (36.6 C)  TempSrc: Oral  SpO2: 100%    General appearance: alert; no distress Eyes: PERRLA; EOMI HENT: normocephalic; atraumatic Neck: supple Lungs: clear to auscultation bilaterally Heart: regular rate and rhythm without murmer Abdomen: soft,  non-tender Extremities: no edema; symmetrical with no gross deformities Skin: warm and dry Psychological: alert and cooperative; normal mood and affect   Allergies  Allergen Reactions  . Ibuprofen Other (See Comments)    ulcers    Past Medical History:  Diagnosis Date  . GERD (gastroesophageal reflux disease)   . Heart murmur   . Hypertension   . Ulcer    Social History   Socioeconomic History  . Marital status: Married    Spouse name: Not on file  . Number of children: Not on file  . Years of education: Not on file  . Highest education level: Not on file  Occupational History  . Not on file  Social Needs  . Financial resource strain: Not on file  . Food insecurity:    Worry: Not on file    Inability: Not on file  . Transportation needs:    Medical: Not on file    Non-medical: Not on file  Tobacco Use  . Smoking status: Current Every Day Smoker    Packs/day: 0.50    Years: 30.00    Pack years: 15.00    Types: Cigarettes  . Smokeless tobacco: Never Used  Substance and Sexual Activity  . Alcohol use: No  . Drug use: No  . Sexual activity: Not on file  Lifestyle  . Physical activity:    Days per week: Not on file    Minutes per session: Not on file  . Stress: Not on file  Relationships  . Social connections:  Talks on phone: Not on file    Gets together: Not on file    Attends religious service: Not on file    Active member of club or organization: Not on file    Attends meetings of clubs or organizations: Not on file    Relationship status: Not on file  . Intimate partner violence:    Fear of current or ex partner: Not on file    Emotionally abused: Not on file    Physically abused: Not on file    Forced sexual activity: Not on file  Other Topics Concern  . 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    Family History  Problem Relation Age of Onset  . Kidney disease Mother   . Hypertension Mother   . Anemia Mother   . Stroke Mother 62  . Cancer Father        throat cancer  . Hypertension Father   . Heart disease Maternal Grandmother   . Glaucoma Maternal Grandmother   . Diabetes Sister   . Heart attack Son    Past Surgical History:  Procedure Laterality Date  . APPENDECTOMY    . CESAREAN SECTION     2 c sections  . TUBAL LIGATION    . WISDOM TOOTH EXTRACTION        Mardella Layman, MD 11/18/18 [phone]

## 2018-10-24 NOTE — ED Triage Notes (Signed)
Patient was to have surgery on feet this morning, but blood pressure was 215/110.    Patient is not on any blood pressure medicine.

## 2018-10-25 ENCOUNTER — Telehealth: Payer: Self-pay | Admitting: *Deleted

## 2018-10-25 NOTE — Telephone Encounter (Signed)
"  I'm calling to verify if patient had surgery on 10/24/2018."  Yes, she did have surgery yesterday.  "What were the procedures?"  She had a metatarsal osteotomy bilateral and a hammer toe repair 4th right.  "Does she have a follow-up appointment scheduled?"  Yes, it is October 30, 2018.  "Thank you for your help."

## 2018-10-28 ENCOUNTER — Ambulatory Visit: Payer: PRIVATE HEALTH INSURANCE | Admitting: Family Medicine

## 2018-10-30 ENCOUNTER — Encounter: Payer: Self-pay | Admitting: Emergency Medicine

## 2018-10-30 ENCOUNTER — Encounter: Payer: PRIVATE HEALTH INSURANCE | Admitting: Podiatry

## 2018-10-30 ENCOUNTER — Other Ambulatory Visit: Payer: Self-pay

## 2018-10-30 ENCOUNTER — Ambulatory Visit: Payer: PRIVATE HEALTH INSURANCE | Admitting: Emergency Medicine

## 2018-10-30 VITALS — BP 140/78 | HR 104 | Temp 98.1°F | Resp 16 | Ht 59.0 in | Wt 159.0 lb

## 2018-10-30 DIAGNOSIS — I10 Essential (primary) hypertension: Secondary | ICD-10-CM | POA: Diagnosis not present

## 2018-10-30 DIAGNOSIS — Z124 Encounter for screening for malignant neoplasm of cervix: Secondary | ICD-10-CM | POA: Diagnosis not present

## 2018-10-30 DIAGNOSIS — Z1211 Encounter for screening for malignant neoplasm of colon: Secondary | ICD-10-CM | POA: Diagnosis not present

## 2018-10-30 DIAGNOSIS — Z1239 Encounter for other screening for malignant neoplasm of breast: Secondary | ICD-10-CM | POA: Diagnosis not present

## 2018-10-30 NOTE — Progress Notes (Signed)
BP Readings from Last 3 Encounters:  10/30/18 140/78  10/24/18 (!) 188/85  08/19/18 (!) 185/99   Jenny Montgomery 57 y.o.   Chief Complaint  Patient presents with  . Hypertension    follow up - patient was seen at [facility] 10/24/2018 for HTN, also was scheduled for foot surgery and postponed     HISTORY OF PRESENT ILLNESS: This is a 57 y.o. female complaining of high blood pressure.  Seen in urgent care center on 10/24/2018 and started on amlodipine 5 mg daily.  Asymptomatic.  No complaints today. Blood pressure readings at home reviewed with patient.  Blood pressure steadily improving daily.  Responding well to amlodipine.  HPI   Prior to Admission medications   Medication Sig Start Date End Date Taking? Authorizing Provider  amLODipine (NORVASC) 5 MG tablet Take 1 tablet (5 mg total) by mouth daily. 10/24/18  Yes Mardella Layman, MD    Allergies  Allergen Reactions  . Ibuprofen Other (See Comments)    ulcers    There are no active problems to display for this patient.   Past Medical History:  Diagnosis Date  . GERD (gastroesophageal reflux disease)   . Heart murmur   . Hypertension   . Ulcer     Past Surgical History:  Procedure Laterality Date  . APPENDECTOMY    . CESAREAN SECTION     2 c sections  . TUBAL LIGATION    . WISDOM TOOTH EXTRACTION      Social History   Socioeconomic History  . Marital status: Married    Spouse name: Not on file  . Number of children: Not on file  . Years of education: Not on file  . Highest education level: Not on file  Occupational History  . Not on file  Social Needs  . Financial resource strain: Not on file  . Food insecurity:    Worry: Not on file    Inability: Not on file  . Transportation needs:    Medical: Not on file    Non-medical: Not on file  Tobacco Use  . Smoking status: Current Every Day Smoker    Packs/day: 0.50    Years: 30.00    Pack years: 15.00    Types: Cigarettes  . Smokeless  tobacco: Never Used  Substance and Sexual Activity  . Alcohol use: No  . Drug use: No  . Sexual activity: Not on file  Lifestyle  . Physical activity:    Days per week: Not on file    Minutes per session: Not on file  . Stress: Not on file  Relationships  . Social connections:    Talks on phone: Not on file    Gets together: Not on file    Attends religious service: Not on file    Active member of club or organization: Not on file    Attends meetings of clubs or organizations: Not on file    Relationship status: Not on file  . Intimate partner violence:    Fear of current or ex partner: Not on file    Emotionally abused: Not on file    Physically abused: Not on file    Forced sexual activity: Not on file  Other Topics Concern  . 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    Family History  Problem Relation Age of Onset  . Kidney disease Mother   . Hypertension Mother   . Anemia Mother   . Stroke Mother [id number]  . Cancer Father        throat cancer  . Hypertension Father   . Heart disease Maternal Grandmother   . Glaucoma Maternal Grandmother   . Diabetes Sister   . Heart attack Son      Review of Systems  Constitutional: Negative.  Negative for chills and fever.  HENT: Negative.  Negative for congestion, nosebleeds and sore throat.   Eyes: Negative.  Negative for blurred vision and double vision.  Respiratory: Negative.  Negative for cough and shortness of breath.   Cardiovascular: Negative.  Negative for chest pain and palpitations.  Gastrointestinal: Negative for abdominal pain, diarrhea, nausea and vomiting.  Genitourinary: Negative.  Negative for dysuria and hematuria.  Skin: Negative.  Negative for rash.  Neurological: Negative.  Negative for dizziness, sensory change, focal weakness and headaches.  Endo/Heme/Allergies:  Negative.   All other systems reviewed and are negative.   Vitals:   10/30/18 1616  BP: 140/78  Pulse: (!) 104  Resp: 16  Temp: 98.1 F (36.7 C)  SpO2: 98%    Physical Exam  Constitutional: She is oriented to person, place, and time. She appears well-developed and well-nourished.  HENT:  Head: Normocephalic and atraumatic.  Nose: Nose normal.  Mouth/Throat: Oropharynx is clear and moist.  Eyes: Pupils are equal, round, and reactive to light. Conjunctivae and EOM are normal.  Neck: Normal range of motion. Neck supple.  Cardiovascular: Normal rate, regular rhythm and normal heart sounds.  Pulmonary/Chest: Effort normal and breath sounds normal.  Abdominal: Soft. Bowel sounds are normal. She exhibits no distension. There is no tenderness.  Musculoskeletal: Normal range of motion.  Neurological: She is alert and oriented to person, place, and time. No sensory deficit. She exhibits normal muscle tone. Coordination normal.  Skin: Skin is warm and dry. Capillary refill takes less than 2 seconds.  Psychiatric: She has a normal mood and affect. Her behavior is normal.  Vitals reviewed.    ASSESSMENT & PLAN: Essential hypertension Blood pressure responding well to medication.  Will continue amlodipine 5 mg a day.  Follow-up in 4 weeks.  Blood work done today.   [name] was seen today for hypertension.  Diagnoses and all orders for this visit:  Essential hypertension -     Comprehensive metabolic panel -     CBC with Differential/Platelet -     Hemoglobin A1c  Colon cancer screening -     Ambulatory referral to Gastroenterology  Cervical cancer screening -     Ambulatory referral to Obstetrics / Gynecology  Breast cancer screening -     MM Digital Screening; Future    Patient Instructions       If you have lab work done today you will be contacted with your lab results within the next 2 weeks.  If you have not heard from us then please contact us. The fastest way to  get your results is to register for My Chart.   IF you received an x-ray today, you will receive an invoice from [facility]. Please contact [facility] at [phone] with questions or concerns regarding your invoice.   IF you received labwork today, you will receive an invoice from [facility]. Please contact [facility] at [phone] with questions or concerns regarding your invoice.   Our billing staff will  not be able to assist you with questions regarding bills from these companies.  You will be contacted with the lab results as soon as they are available. The fastest way to get your results is to activate your My Chart account. Instructions are located on the last page of this paperwork. If you have not heard from [place] regarding the results in 2 weeks, please contact this office.     Hypertension Hypertension, commonly called high blood pressure, is when the force of blood pumping through the arteries is too strong. The arteries are the blood vessels that carry blood from the heart throughout the body. Hypertension forces the heart to work harder to pump blood and may cause arteries to become narrow or stiff. Having untreated or uncontrolled hypertension can cause heart attacks, strokes, kidney disease, and other problems. A blood pressure reading consists of a higher number over a lower number. Ideally, your blood pressure should be below 120/80. The first ("top") number is called the systolic pressure. It is a measure of the pressure in your arteries as your heart beats. The second ("bottom") number is called the diastolic pressure. It is a measure of the pressure in your arteries as the heart relaxes. What are the causes? The cause of this condition is not known. What increases the risk? Some risk factors for high blood pressure are under your control. Others are not. Factors you can change  Smoking.  Having type 2 diabetes mellitus, high cholesterol, or both.  Not  getting enough exercise or physical activity.  Being overweight.  Having too much fat, sugar, calories, or salt (sodium) in your diet.  Drinking too much alcohol. Factors that are difficult or impossible to change  Having chronic kidney disease.  Having a family history of high blood pressure.  Age. Risk increases with age.  Race. You may be at higher risk if you are African-American.  Gender. Men are at higher risk than women before age 59. After age 51, women are at higher risk than men.  Having obstructive sleep apnea.  Stress. What are the signs or symptoms? Extremely high blood pressure (hypertensive crisis) may cause:  Headache.  Anxiety.  Shortness of breath.  Nosebleed.  Nausea and vomiting.  Severe chest pain.  Jerky movements you cannot control (seizures).  How is this diagnosed? This condition is diagnosed by measuring your blood pressure while you are seated, with your arm resting on a surface. The cuff of the blood pressure monitor will be placed directly against the skin of your upper arm at the level of your heart. It should be measured at least twice using the same arm. Certain conditions can cause a difference in blood pressure between your right and left arms. Certain factors can cause blood pressure readings to be lower or higher than normal (elevated) for a short period of time:  When your blood pressure is higher when you are in a health care provider's office than when you are at home, this is called white coat hypertension. Most people with this condition do not need medicines.  When your blood pressure is higher at home than when you are in a health care provider's office, this is called masked hypertension. Most people with this condition may need medicines to control blood pressure.  If you have a high blood pressure reading during one visit or you have normal blood pressure with other risk factors:  You may be asked to return on a different  day to have your blood  pressure checked again.  You may be asked to monitor your blood pressure at home for 1 week or longer.  If you are diagnosed with hypertension, you may have other blood or imaging tests to help your health care provider understand your overall risk for other conditions. How is this treated? This condition is treated by making healthy lifestyle changes, such as eating healthy foods, exercising more, and reducing your alcohol intake. Your health care provider may prescribe medicine if lifestyle changes are not enough to get your blood pressure under control, and if:  Your systolic blood pressure is above 130.  Your diastolic blood pressure is above 80.  Your personal target blood pressure may vary depending on your medical conditions, your age, and other factors. Follow these instructions at home: Eating and drinking  Eat a diet that is high in fiber and potassium, and low in sodium, added sugar, and fat. An example eating plan is called the DASH (Dietary Approaches to Stop Hypertension) diet. To eat this way: ? Eat plenty of fresh fruits and vegetables. Try to fill half of your plate at each meal with fruits and vegetables. ? Eat whole grains, such as whole wheat pasta, brown rice, or whole grain bread. Fill about one quarter of your plate with whole grains. ? Eat or drink low-fat dairy products, such as skim milk or low-fat yogurt. ? Avoid fatty cuts of meat, processed or cured meats, and poultry with skin. Fill about one quarter of your plate with lean proteins, such as fish, chicken without skin, beans, eggs, and tofu. ? Avoid premade and processed foods. These tend to be higher in sodium, added sugar, and fat.  Reduce your daily sodium intake. Most people with hypertension should eat less than 1,500 mg of sodium a day.  Limit alcohol intake to no more than 1 drink a day for nonpregnant women and 2 drinks a day for men. One drink equals 12 oz of beer, 5 oz of wine,  or 1 oz of hard liquor. Lifestyle  Work with your health care provider to maintain a healthy body weight or to lose weight. Ask what an ideal weight is for you.  Get at least 30 minutes of exercise that causes your heart to beat faster (aerobic exercise) most days of the week. Activities may include walking, swimming, or biking.  Include exercise to strengthen your muscles (resistance exercise), such as pilates or lifting weights, as part of your weekly exercise routine. Try to do these types of exercises for 30 minutes at least 3 days a week.  Do not use any products that contain nicotine or tobacco, such as cigarettes and e-cigarettes. If you need help quitting, ask your health care provider.  Monitor your blood pressure at home as told by your health care provider.  Keep all follow-up visits as told by your health care provider. This is important. Medicines  Take over-the-counter and prescription medicines only as told by your health care provider. Follow directions carefully. Blood pressure medicines must be taken as prescribed.  Do not skip doses of blood pressure medicine. Doing this puts you at risk for problems and can make the medicine less effective.  Ask your health care provider about side effects or reactions to medicines that you should watch for. Contact a health care provider if:  You think you are having a reaction to a medicine you are taking.  You have headaches that keep coming back (recurring).  You feel dizzy.  You have swelling in  your ankles.  You have trouble with your vision. Get help right away if:  You develop a severe headache or confusion.  You have unusual weakness or numbness.  You feel faint.  You have severe pain in your chest or abdomen.  You vomit repeatedly.  You have trouble breathing. Summary  Hypertension is when the force of blood pumping through your arteries is too strong. If this condition is not controlled, it may put you at  risk for serious complications.  Your personal target blood pressure may vary depending on your medical conditions, your age, and other factors. For most people, a normal blood pressure is less than 120/80.  Hypertension is treated with lifestyle changes, medicines, or a combination of both. Lifestyle changes include weight loss, eating a healthy, low-sodium diet, exercising more, and limiting alcohol. This information is not intended to replace advice given to you by your health care provider. Make sure you discuss any questions you have with your health care provider. Document Released: 11/27/2005 Document Revised: 10/25/2016 Document Reviewed: 10/25/2016 Elsevier Interactive Patient Education  2018 Elsevier Inc.      Edwina Barth, MD [facility]

## 2018-10-30 NOTE — Patient Instructions (Addendum)
   If you have lab work done today you will be contacted with your lab results within the next 2 weeks.  If you have not heard from us then please contact us. The fastest way to get your results is to register for My Chart.   IF you received an x-ray today, you will receive an invoice from [facility]. Please contact [facility] at [phone] with questions or concerns regarding your invoice.   IF you received labwork today, you will receive an invoice from [facility]. Please contact [facility] at [phone] with questions or concerns regarding your invoice.   Our billing staff will not be able to assist you with questions regarding bills from these companies.  You will be contacted with the lab results as soon as they are available. The fastest way to get your results is to activate your My Chart account. Instructions are located on the last page of this paperwork. If you have not heard from us regarding the results in 2 weeks, please contact this office.     Hypertension Hypertension, commonly called high blood pressure, is when the force of blood pumping through the arteries is too strong. The arteries are the blood vessels that carry blood from the heart throughout the body. Hypertension forces the heart to work harder to pump blood and may cause arteries to become narrow or stiff. Having untreated or uncontrolled hypertension can cause heart attacks, strokes, kidney disease, and other problems. A blood pressure reading consists of a higher number over a lower number. Ideally, your blood pressure should be below 120/80. The first ("top") number is called the systolic pressure. It is a measure of the pressure in your arteries as your heart beats. The second ("bottom") number is called the diastolic pressure. It is a measure of the pressure in your arteries as the heart relaxes. What are the causes? The cause of this condition is not known. What increases the risk? Some  risk factors for high blood pressure are under your control. Others are not. Factors you can change  Smoking.  Having type 2 diabetes mellitus, high cholesterol, or both.  Not getting enough exercise or physical activity.  Being overweight.  Having too much fat, sugar, calories, or salt (sodium) in your diet.  Drinking too much alcohol. Factors that are difficult or impossible to change  Having chronic kidney disease.  Having a family history of high blood pressure.  Age. Risk increases with age.  Race. You may be at higher risk if you are African-American.  Gender. Men are at higher risk than women before age 45. After age 65, women are at higher risk than men.  Having obstructive sleep apnea.  Stress. What are the signs or symptoms? Extremely high blood pressure (hypertensive crisis) may cause:  Headache.  Anxiety.  Shortness of breath.  Nosebleed.  Nausea and vomiting.  Severe chest pain.  Jerky movements you cannot control (seizures).  How is this diagnosed? This condition is diagnosed by measuring your blood pressure while you are seated, with your arm resting on a surface. The cuff of the blood pressure monitor will be placed directly against the skin of your upper arm at the level of your heart. It should be measured at least twice using the same arm. Certain conditions can cause a difference in blood pressure between your right and left arms. Certain factors can cause blood pressure readings to be lower or higher than normal (elevated) for a short period of time:  When   your blood pressure is higher when you are in a health care provider's office than when you are at home, this is called white coat hypertension. Most people with this condition do not need medicines.  When your blood pressure is higher at home than when you are in a health care provider's office, this is called masked hypertension. Most people with this condition may need medicines to control  blood pressure.  If you have a high blood pressure reading during one visit or you have normal blood pressure with other risk factors:  You may be asked to return on a different day to have your blood pressure checked again.  You may be asked to monitor your blood pressure at home for 1 week or longer.  If you are diagnosed with hypertension, you may have other blood or imaging tests to help your health care provider understand your overall risk for other conditions. How is this treated? This condition is treated by making healthy lifestyle changes, such as eating healthy foods, exercising more, and reducing your alcohol intake. Your health care provider may prescribe medicine if lifestyle changes are not enough to get your blood pressure under control, and if:  Your systolic blood pressure is above 130.  Your diastolic blood pressure is above 80.  Your personal target blood pressure may vary depending on your medical conditions, your age, and other factors. Follow these instructions at home: Eating and drinking  Eat a diet that is high in fiber and potassium, and low in sodium, added sugar, and fat. An example eating plan is called the DASH (Dietary Approaches to Stop Hypertension) diet. To eat this way: ? Eat plenty of fresh fruits and vegetables. Try to fill half of your plate at each meal with fruits and vegetables. ? Eat whole grains, such as whole wheat pasta, brown rice, or whole grain bread. Fill about one quarter of your plate with whole grains. ? Eat or drink low-fat dairy products, such as skim milk or low-fat yogurt. ? Avoid fatty cuts of meat, processed or cured meats, and poultry with skin. Fill about one quarter of your plate with lean proteins, such as fish, chicken without skin, beans, eggs, and tofu. ? Avoid premade and processed foods. These tend to be higher in sodium, added sugar, and fat.  Reduce your daily sodium intake. Most people with hypertension should eat less  than 1,500 mg of sodium a day.  Limit alcohol intake to no more than 1 drink a day for nonpregnant women and 2 drinks a day for men. One drink equals 12 oz of beer, 5 oz of wine, or 1 oz of hard liquor. Lifestyle  Work with your health care provider to maintain a healthy body weight or to lose weight. Ask what an ideal weight is for you.  Get at least 30 minutes of exercise that causes your heart to beat faster (aerobic exercise) most days of the week. Activities may include walking, swimming, or biking.  Include exercise to strengthen your muscles (resistance exercise), such as pilates or lifting weights, as part of your weekly exercise routine. Try to do these types of exercises for 30 minutes at least 3 days a week.  Do not use any products that contain nicotine or tobacco, such as cigarettes and e-cigarettes. If you need help quitting, ask your health care provider.  Monitor your blood pressure at home as told by your health care provider.  Keep all follow-up visits as told by your health care provider.   This is important. Medicines  Take over-the-counter and prescription medicines only as told by your health care provider. Follow directions carefully. Blood pressure medicines must be taken as prescribed.  Do not skip doses of blood pressure medicine. Doing this puts you at risk for problems and can make the medicine less effective.  Ask your health care provider about side effects or reactions to medicines that you should watch for. Contact a health care provider if:  You think you are having a reaction to a medicine you are taking.  You have headaches that keep coming back (recurring).  You feel dizzy.  You have swelling in your ankles.  You have trouble with your vision. Get help right away if:  You develop a severe headache or confusion.  You have unusual weakness or numbness.  You feel faint.  You have severe pain in your chest or abdomen.  You vomit  repeatedly.  You have trouble breathing. Summary  Hypertension is when the force of blood pumping through your arteries is too strong. If this condition is not controlled, it may put you at risk for serious complications.  Your personal target blood pressure may vary depending on your medical conditions, your age, and other factors. For most people, a normal blood pressure is less than 120/80.  Hypertension is treated with lifestyle changes, medicines, or a combination of both. Lifestyle changes include weight loss, eating a healthy, low-sodium diet, exercising more, and limiting alcohol. This information is not intended to replace advice given to you by your health care provider. Make sure you discuss any questions you have with your health care provider. Document Released: 11/27/2005 Document Revised: 10/25/2016 Document Reviewed: 10/25/2016 Elsevier Interactive Patient Education  2018 [employer]vier Inc.  

## 2018-10-30 NOTE — Assessment & Plan Note (Signed)
Blood pressure responding well to medication.  Will continue amlodipine 5 mg a day.  Follow-up in 4 weeks.  Blood work done today.

## 2018-10-31 ENCOUNTER — Encounter: Payer: Self-pay | Admitting: *Deleted

## 2018-10-31 LAB — CBC WITH DIFFERENTIAL/PLATELET
BASOS ABS: 0.1 10*3/uL (ref 0.0–0.2)
Basos: 1 %
EOS (ABSOLUTE): 0.2 10*3/uL (ref 0.0–0.4)
Eos: 2 %
HEMATOCRIT: 39.7 % (ref 34.0–46.6)
HEMOGLOBIN: 13.3 g/dL (ref 11.1–15.9)
Immature Grans (Abs): 0 10*3/uL (ref 0.0–0.1)
Immature Granulocytes: 0 %
LYMPHS ABS: 3.2 10*3/uL — AB (ref 0.7–3.1)
Lymphs: 39 %
MCH: 29.7 pg (ref 26.6–33.0)
MCHC: 33.5 g/dL (ref 31.5–35.7)
MCV: 89 fL (ref 79–97)
MONOCYTES: 5 %
Monocytes Absolute: 0.4 10*3/uL (ref 0.1–0.9)
Neutrophils Absolute: 4.3 10*3/uL (ref 1.4–7.0)
Neutrophils: 53 %
Platelets: 335 10*3/uL (ref 150–450)
RBC: 4.48 x10E6/uL (ref 3.77–5.28)
RDW: 14.9 % (ref 12.3–15.4)
WBC: 8.2 10*3/uL (ref 3.4–10.8)

## 2018-10-31 LAB — COMPREHENSIVE METABOLIC PANEL
ALT: 12 IU/L (ref 0–32)
AST: 17 IU/L (ref 0–40)
Albumin/Globulin Ratio: 1.5 (ref 1.2–2.2)
Albumin: 4.5 g/dL (ref 3.5–5.5)
Alkaline Phosphatase: 148 IU/L — ABNORMAL HIGH (ref 39–117)
BILIRUBIN TOTAL: 0.4 mg/dL (ref 0.0–1.2)
BUN / CREAT RATIO: 13 (ref 9–23)
BUN: 10 mg/dL (ref 6–24)
CHLORIDE: 102 mmol/L (ref 96–106)
CO2: 22 mmol/L (ref 20–29)
Calcium: 9.8 mg/dL (ref 8.7–10.2)
Creatinine, Ser: 0.8 mg/dL (ref 0.57–1.00)
GFR calc non Af Amer: 81 mL/min/{1.73_m2} (ref >59)
GFR, EST AFRICAN AMERICAN: 93 mL/min/{1.73_m2} (ref >59)
GLOBULIN, TOTAL: 3 g/dL (ref 1.5–4.5)
GLUCOSE: 102 mg/dL — AB (ref 65–99)
Potassium: 3.9 mmol/L (ref 3.5–5.2)
SODIUM: 142 mmol/L (ref 134–144)
Total Protein: 7.5 g/dL (ref 6.0–8.5)

## 2018-10-31 LAB — HEMOGLOBIN A1C
Est. average glucose Bld gHb Est-mCnc: 114 mg/dL
Hgb A1c MFr Bld: 5.6 % (ref 4.8–5.6)

## 2018-11-06 ENCOUNTER — Encounter: Payer: PRIVATE HEALTH INSURANCE | Admitting: Podiatry

## 2018-11-21 NOTE — Progress Notes (Signed)
DOS  10/24/2018  Tailor's bunionectomy bilateral. Hammer toe repair fourth digit right foot.

## 2018-11-28 ENCOUNTER — Encounter: Payer: Self-pay | Admitting: Emergency Medicine

## 2018-11-28 ENCOUNTER — Other Ambulatory Visit: Payer: Self-pay

## 2018-11-28 ENCOUNTER — Ambulatory Visit: Payer: PRIVATE HEALTH INSURANCE | Admitting: Emergency Medicine

## 2018-11-28 VITALS — BP 161/71 | HR 87 | Temp 98.2°F | Resp 16 | Ht 59.25 in | Wt 160.4 lb

## 2018-11-28 DIAGNOSIS — Z23 Encounter for immunization: Secondary | ICD-10-CM | POA: Diagnosis not present

## 2018-11-28 DIAGNOSIS — L249 Irritant contact dermatitis, unspecified cause: Secondary | ICD-10-CM | POA: Diagnosis not present

## 2018-11-28 DIAGNOSIS — I10 Essential (primary) hypertension: Secondary | ICD-10-CM | POA: Diagnosis not present

## 2018-11-28 MED ORDER — LISINOPRIL 20 MG PO TABS: 20.0000 mg | ORAL_TABLET | Freq: Every day | ORAL | 3 refills | Status: DC

## 2018-11-28 MED ORDER — TRIAMCINOLONE ACETONIDE 0.1 % EX CREA: 1.0000 "application " | TOPICAL_CREAM | Freq: Two times a day (BID) | CUTANEOUS | 0 refills | Status: AC

## 2018-11-28 NOTE — Patient Instructions (Addendum)
   If you have lab work done today you will be contacted with your lab results within the next 2 weeks.  If you have not heard from us then please contact us. The fastest way to get your results is to register for My Chart.   IF you received an x-ray today, you will receive an invoice from [facility]. Please contact [facility] at [phone] with questions or concerns regarding your invoice.   IF you received labwork today, you will receive an invoice from [facility]. Please contact [facility] at [phone] with questions or concerns regarding your invoice.   Our billing staff will not be able to assist you with questions regarding bills from these companies.  You will be contacted with the lab results as soon as they are available. The fastest way to get your results is to activate your My Chart account. Instructions are located on the last page of this paperwork. If you have not heard from us regarding the results in 2 weeks, please contact this office.       Hypertension Hypertension, commonly called high blood pressure, is when the force of blood pumping through the arteries is too strong. The arteries are the blood vessels that carry blood from the heart throughout the body. Hypertension forces the heart to work harder to pump blood and may cause arteries to become narrow or stiff. Having untreated or uncontrolled hypertension can cause heart attacks, strokes, kidney disease, and other problems. A blood pressure reading consists of a higher number over a lower number. Ideally, your blood pressure should be below 120/80. The first ("top") number is called the systolic pressure. It is a measure of the pressure in your arteries as your heart beats. The second ("bottom") number is called the diastolic pressure. It is a measure of the pressure in your arteries as the heart relaxes. What are the causes? The cause of this condition is not known. What increases the  risk? Some risk factors for high blood pressure are under your control. Others are not. Factors you can change  Smoking.  Having type 2 diabetes mellitus, high cholesterol, or both.  Not getting enough exercise or physical activity.  Being overweight.  Having too much fat, sugar, calories, or salt (sodium) in your diet.  Drinking too much alcohol. Factors that are difficult or impossible to change  Having chronic kidney disease.  Having a family history of high blood pressure.  Age. Risk increases with age.  Race. You may be at higher risk if you are African-American.  Gender. Men are at higher risk than women before age 45. After age 65, women are at higher risk than men.  Having obstructive sleep apnea.  Stress. What are the signs or symptoms? Extremely high blood pressure (hypertensive crisis) may cause:  Headache.  Anxiety.  Shortness of breath.  Nosebleed.  Nausea and vomiting.  Severe chest pain.  Jerky movements you cannot control (seizures). How is this diagnosed? This condition is diagnosed by measuring your blood pressure while you are seated, with your arm resting on a surface. The cuff of the blood pressure monitor will be placed directly against the skin of your upper arm at the level of your heart. It should be measured at least twice using the same arm. Certain conditions can cause a difference in blood pressure between your right and left arms. Certain factors can cause blood pressure readings to be lower or higher than normal (elevated) for a short period of time:    When your blood pressure is higher when you are in a health care provider's office than when you are at home, this is called white coat hypertension. Most people with this condition do not need medicines.  When your blood pressure is higher at home than when you are in a health care provider's office, this is called masked hypertension. Most people with this condition may need medicines  to control blood pressure. If you have a high blood pressure reading during one visit or you have normal blood pressure with other risk factors:  You may be asked to return on a different day to have your blood pressure checked again.  You may be asked to monitor your blood pressure at home for 1 week or longer. If you are diagnosed with hypertension, you may have other blood or imaging tests to help your health care provider understand your overall risk for other conditions. How is this treated? This condition is treated by making healthy lifestyle changes, such as eating healthy foods, exercising more, and reducing your alcohol intake. Your health care provider may prescribe medicine if lifestyle changes are not enough to get your blood pressure under control, and if:  Your systolic blood pressure is above 130.  Your diastolic blood pressure is above 80. Your personal target blood pressure may vary depending on your medical conditions, your age, and other factors. Follow these instructions at home: Eating and drinking   Eat a diet that is high in fiber and potassium, and low in sodium, added sugar, and fat. An example eating plan is called the DASH (Dietary Approaches to Stop Hypertension) diet. To eat this way: ? Eat plenty of fresh fruits and vegetables. Try to fill half of your plate at each meal with fruits and vegetables. ? Eat whole grains, such as whole wheat pasta, brown rice, or whole grain bread. Fill about one quarter of your plate with whole grains. ? Eat or drink low-fat dairy products, such as skim milk or low-fat yogurt. ? Avoid fatty cuts of meat, processed or cured meats, and poultry with skin. Fill about one quarter of your plate with lean proteins, such as fish, chicken without skin, beans, eggs, and tofu. ? Avoid premade and processed foods. These tend to be higher in sodium, added sugar, and fat.  Reduce your daily sodium intake. Most people with hypertension should  eat less than 1,500 mg of sodium a day.  Limit alcohol intake to no more than 1 drink a day for nonpregnant women and 2 drinks a day for men. One drink equals 12 oz of beer, 5 oz of wine, or 1 oz of hard liquor. Lifestyle   Work with your health care provider to maintain a healthy body weight or to lose weight. Ask what an ideal weight is for you.  Get at least 30 minutes of exercise that causes your heart to beat faster (aerobic exercise) most days of the week. Activities may include walking, swimming, or biking.  Include exercise to strengthen your muscles (resistance exercise), such as pilates or lifting weights, as part of your weekly exercise routine. Try to do these types of exercises for 30 minutes at least 3 days a week.  Do not use any products that contain nicotine or tobacco, such as cigarettes and e-cigarettes. If you need help quitting, ask your health care provider.  Monitor your blood pressure at home as told by your health care provider.  Keep all follow-up visits as told by your health care provider.   This is important. Medicines  Take over-the-counter and prescription medicines only as told by your health care provider. Follow directions carefully. Blood pressure medicines must be taken as prescribed.  Do not skip doses of blood pressure medicine. Doing this puts you at risk for problems and can make the medicine less effective.  Ask your health care provider about side effects or reactions to medicines that you should watch for. Contact a health care provider if:  You think you are having a reaction to a medicine you are taking.  You have headaches that keep coming back (recurring).  You feel dizzy.  You have swelling in your ankles.  You have trouble with your vision. Get help right away if:  You develop a severe headache or confusion.  You have unusual weakness or numbness.  You feel faint.  You have severe pain in your chest or abdomen.  You vomit  repeatedly.  You have trouble breathing. Summary  Hypertension is when the force of blood pumping through your arteries is too strong. If this condition is not controlled, it may put you at risk for serious complications.  Your personal target blood pressure may vary depending on your medical conditions, your age, and other factors. For most people, a normal blood pressure is less than 120/80.  Hypertension is treated with lifestyle changes, medicines, or a combination of both. Lifestyle changes include weight loss, eating a healthy, low-sodium diet, exercising more, and limiting alcohol. This information is not intended to replace advice given to you by your health care provider. Make sure you discuss any questions you have with your health care provider. Document Released: 11/27/2005 Document Revised: 10/25/2016 Document Reviewed: 10/25/2016 Elsevier Interactive Patient Education  2019 Elsevier Inc.  

## 2018-11-28 NOTE — Progress Notes (Signed)
BP Readings from Last 3 Encounters:  11/28/18 (!) 161/71  10/30/18 140/78  10/24/18 (!) 188/85   Jenny Montgomery 57 y.o.   Chief Complaint  Patient presents with  . Hypertension    FOLLOW UP-per patient stopped taking amlodipine because it caused leg cramps  . Rash    Left wrist  x 2 weeks    HISTORY OF PRESENT ILLNESS: This is a 57 y.o. female with history of hypertension here for follow-up.  Stopped taking amlodipine 1 week ago due to leg cramps.  Cramps are gone now.  Blood pressure at home in the high side. Also complaining of a rash to left wrist, most likely due to watch/wrist band.  Looks like a contact dermatitis.  HPI   Prior to Admission medications   Medication Sig Start Date End Date Taking? Authorizing Provider  amLODipine (NORVASC) 5 MG tablet Take 1 tablet (5 mg total) by mouth daily. Patient not taking: Reported on 11/28/2018 10/24/18   Mardella LaymanHagler, Brian, MD    Allergies  Allergen Reactions  . Amlodipine Other (See Comments)    Per patient causes leg cramps  . Ibuprofen Other (See Comments)    ulcers    Patient Active Problem List   Diagnosis Date Noted  . Essential hypertension 10/30/2018    Past Medical History:  Diagnosis Date  . GERD (gastroesophageal reflux disease)   . Heart murmur   . Hypertension   . Ulcer     Past Surgical History:  Procedure Laterality Date  . APPENDECTOMY    . CESAREAN SECTION     2 c sections  . TUBAL LIGATION    . WISDOM TOOTH EXTRACTION      Social History   Socioeconomic History  . Marital status: Married    Spouse name: Not on file  . Number of children: Not on file  . Years of education: Not on file  . Highest education level: Not on file  Occupational History  . Not on file  Social Needs  . Financial resource strain: Not on file  . Food insecurity:    Worry: Not on file    Inability: Not on file  . Transportation needs:    Medical: Not on file    Non-medical: Not on file  Tobacco Use  .  Smoking status: Current Every Day Smoker    Packs/day: 0.50    Years: 30.00    Pack years: 15.00    Types: Cigarettes  . Smokeless tobacco: Never Used  Substance and Sexual Activity  . Alcohol use: No  . Drug use: No  . Sexual activity: Not on file  Lifestyle  . Physical activity:    Days per week: Not on file    Minutes per session: Not on file  . Stress: Not on file  Relationships  . Social connections:    Talks on phone: Not on file    Gets together: Not on file    Attends religious service: Not on file    Active member of club or organization: Not on file    Attends meetings of clubs or organizations: Not on file    Relationship status: Not on file  . Intimate partner violence:    Fear of current or ex partner: Not on file    Emotionally abused: Not on file    Physically abused: Not on file    Forced sexual activity: Not on file  Other Topics Concern  . 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    Family History  Problem Relation Age of Onset  . Kidney disease Mother   . Hypertension Mother   . Anemia Mother   . Stroke Mother 66  . Cancer Father        throat cancer  . Hypertension Father   . Heart disease Maternal Grandmother   . Glaucoma Maternal Grandmother   . Diabetes Sister   . Heart attack Son      Review of Systems  Constitutional: Negative.  Negative for chills and fever.  HENT: Negative.  Negative for congestion, sinus pain and sore throat.   Eyes: Negative.  Negative for blurred vision and double vision.  Respiratory: Negative.  Negative for cough and shortness of breath.   Cardiovascular: Negative.  Negative for chest pain and palpitations.  Gastrointestinal: Negative.  Negative for abdominal pain, nausea and vomiting.  Musculoskeletal: Negative.  Negative for back pain, myalgias and neck pain.  Skin:  Positive for rash.  Neurological: Negative for dizziness and headaches.  Endo/Heme/Allergies: Negative.     Vitals:   11/28/18 0919  BP: (!) 161/71  Pulse: 87  Resp: 16  Temp: 98.2 F (36.8 C)  SpO2: 100%    Physical Exam Vitals signs reviewed.  Constitutional:      Appearance: Normal appearance.  HENT:     Head: Normocephalic and atraumatic.     Mouth/Throat:     Mouth: Mucous membranes are moist.     Pharynx: Oropharynx is clear.  Eyes:     Extraocular Movements: Extraocular movements intact.     Conjunctiva/sclera: Conjunctivae normal.     Pupils: Pupils are equal, round, and reactive to light.  Neck:     Musculoskeletal: Normal range of motion.  Cardiovascular:     Rate and Rhythm: Normal rate and regular rhythm.     Pulses: Normal pulses.     Heart sounds: Normal heart sounds.  Pulmonary:     Effort: Pulmonary effort is normal.     Breath sounds: Normal breath sounds.  Musculoskeletal: Normal range of motion.  Skin:    Capillary Refill: Capillary refill takes less than 2 seconds.     Findings: Rash present.     Comments: Positive rash around left wrist following distribution of watch wrist band.  Patient has been using Fitbit  Neurological:     General: No focal deficit present.     Mental Status: She is alert and oriented to person, place, and time.     Sensory: No sensory deficit.     Motor: No weakness.     Coordination: Coordination normal.  Psychiatric:        Mood and Affect: Mood normal.        Behavior: Behavior normal.      ASSESSMENT & PLAN: Essential hypertension Patient took herself off amlodipine due to leg cramps.  Blood pressures at home have been high.  Systolic blood pressure in the office today elevated.  Asymptomatic.  We will start lisinopril 20 mg a day.  Follow-up in 4 weeks.  Lometa was seen today for hypertension and rash.  Diagnoses and all orders for this visit:  Essential hypertension  Need for prophylactic vaccination  and inoculation against influenza -     Flu Vaccine QUAD 36+ mos IM  Irritant contact dermatitis, unspecified trigger -  triamcinolone cream (KENALOG) 0.1 %; Apply 1 application topically 2 (two) times daily.  Other orders -     lisinopril (PRINIVIL,ZESTRIL) 20 MG tablet; Take 1 tablet (20 mg total) by mouth daily.    Patient Instructions       If you have lab work done today you will be contacted with your lab results within the next 2 weeks.  If you have not heard from us then please contact us. The fastest way to get your results is to register for My Chart.   IF you received an x-ray today, you will receive an invoice [facility]. Please contact [facility] at [phone] with questions or concerns regarding your invoice.   IF you received labwork today, you will receive an invoice from [facility]. Please contact [facility] at [phone] with questions or concerns regarding your invoice.   Our billing staff will not be able to assist you with questions regarding bills from these companies.  You will be contacted with the lab results as soon as they are available. The fastest way to get your results is to activate your My Chart account. Instructions are located on the last page of this paperwork. If you have not heard from us regarding the results in 2 weeks, please contact this office.        Hypertension Hypertension, commonly called high blood pressure, is when the force of blood pumping through the arteries is too strong. The arteries are the blood vessels that carry blood from the heart throughout the body. Hypertension forces the heart to work harder to pump blood and may cause arteries to become narrow or stiff. Having untreated or uncontrolled hypertension can cause heart attacks, strokes, kidney disease, and other problems. A blood pressure reading consists of a higher number over a lower number. Ideally, your blood pressure should be below  120/80. The first ("top") number is called the systolic pressure. It is a measure of the pressure in your arteries as your heart beats. The second ("bottom") number is called the diastolic pressure. It is a measure of the pressure in your arteries as the heart relaxes. What are the causes? The cause of this condition is not known. What increases the risk? Some risk factors for high blood pressure are under your control. Others are not. Factors you can change  Smoking.  Having type 2 diabetes mellitus, high cholesterol, or both.  Not getting enough exercise or physical activity.  Being overweight.  Having too much fat, sugar, calories, or salt (sodium) in your diet.  Drinking too much alcohol. Factors that are difficult or impossible to change  Having chronic kidney disease.  Having a family history of high blood pressure.  Age. Risk increases with age.  Race. You may be at higher risk if you are African-American.  Gender. Men are at higher risk than women before age 59. After age 59, women are at higher risk than men.  Having obstructive sleep apnea.  Stress. What are the signs or symptoms? Extremely high blood pressure (hypertensive crisis) may cause:  Headache.  Anxiety.  Shortness of breath.  Nosebleed.  Nausea and vomiting.  Severe chest pain.  Jerky movements you cannot control (seizures). How is this diagnosed? This condition is diagnosed by measuring your blood pressure while you are seated, with your arm resting on a surface. The cuff of the blood pressure monitor will be placed directly against the skin of your upper arm at the level of your heart. It should be measured  at least twice using the same arm. Certain conditions can cause a difference in blood pressure between your right and left arms. Certain factors can cause blood pressure readings to be lower or higher than normal (elevated) for a short period of time:  When your blood pressure is higher  when you are in a health care provider's office than when you are at home, this is called white coat hypertension. Most people with this condition do not need medicines.  When your blood pressure is higher at home than when you are in a health care provider's office, this is called masked hypertension. Most people with this condition may need medicines to control blood pressure. If you have a high blood pressure reading during one visit or you have normal blood pressure with other risk factors:  You may be asked to return on a different day to have your blood pressure checked again.  You may be asked to monitor your blood pressure at home for 1 week or longer. If you are diagnosed with hypertension, you may have other blood or imaging tests to help your health care provider understand your overall risk for other conditions. How is this treated? This condition is treated by making healthy lifestyle changes, such as eating healthy foods, exercising more, and reducing your alcohol intake. Your health care provider may prescribe medicine if lifestyle changes are not enough to get your blood pressure under control, and if:  Your systolic blood pressure is above 130.  Your diastolic blood pressure is above 80. Your personal target blood pressure may vary depending on your medical conditions, your age, and other factors. Follow these instructions at home: Eating and drinking   Eat a diet that is high in fiber and potassium, and low in sodium, added sugar, and fat. An example eating plan is called the DASH (Dietary Approaches to Stop Hypertension) diet. To eat this way: ? Eat plenty of fresh fruits and vegetables. Try to fill half of your plate at each meal with fruits and vegetables. ? Eat whole grains, such as whole wheat pasta, brown rice, or whole grain bread. Fill about one quarter of your plate with whole grains. ? Eat or drink low-fat dairy products, such as skim milk or low-fat  yogurt. ? Avoid fatty cuts of meat, processed or cured meats, and poultry with skin. Fill about one quarter of your plate with lean proteins, such as fish, chicken without skin, beans, eggs, and tofu. ? Avoid premade and processed foods. These tend to be higher in sodium, added sugar, and fat.  Reduce your daily sodium intake. Most people with hypertension should eat less than 1,500 mg of sodium a day.  Limit alcohol intake to no more than 1 drink a day for nonpregnant women and 2 drinks a day for men. One drink equals 12 oz of beer, 5 oz of wine, or 1 oz of hard liquor. Lifestyle   Work with your health care provider to maintain a healthy body weight or to lose weight. Ask what an ideal weight is for you.  Get at least 30 minutes of exercise that causes your heart to beat faster (aerobic exercise) most days of the week. Activities may include walking, swimming, or biking.  Include exercise to strengthen your muscles (resistance exercise), such as pilates or lifting weights, as part of your weekly exercise routine. Try to do these types of exercises for 30 minutes at least 3 days a week.  Do not use any products that contain  nicotine or tobacco, such as cigarettes and e-cigarettes. If you need help quitting, ask your health care provider.  Monitor your blood pressure at home as told by your health care provider.  Keep all follow-up visits as told by your health care provider. This is important. Medicines  Take over-the-counter and prescription medicines only as told by your health care provider. Follow directions carefully. Blood pressure medicines must be taken as prescribed.  Do not skip doses of blood pressure medicine. Doing this puts you at risk for problems and can make the medicine less effective.  Ask your health care provider about side effects or reactions to medicines that you should watch for. Contact a health care provider if:  You think you are having a reaction to a  medicine you are taking.  You have headaches that keep coming back (recurring).  You feel dizzy.  You have swelling in your ankles.  You have trouble with your vision. Get help right away if:  You develop a severe headache or confusion.  You have unusual weakness or numbness.  You feel faint.  You have severe pain in your chest or abdomen.  You vomit repeatedly.  You have trouble breathing. Summary  Hypertension is when the force of blood pumping through your arteries is too strong. If this condition is not controlled, it may put you at risk for serious complications.  Your personal target blood pressure may vary depending on your medical conditions, your age, and other factors. For most people, a normal blood pressure is less than 120/80.  Hypertension is treated with lifestyle changes, medicines, or a combination of both. Lifestyle changes include weight loss, eating a healthy, low-sodium diet, exercising more, and limiting alcohol. This information is not intended to replace advice given to you by your health care provider. Make sure you discuss any questions you have with your health care provider. Document Released: 11/27/2005 Document Revised: 10/25/2016 Document Reviewed: 10/25/2016 Elsevier Interactive Patient Education  2019 Elsevier Inc.      Edwina Barth, MD [facility]

## 2018-11-28 NOTE — Assessment & Plan Note (Addendum)
Patient took herself off amlodipine due to leg cramps.  Blood pressures at home have been high.  Systolic blood pressure in the office today elevated.  Asymptomatic.  We will start lisinopril 20 mg a day.  Follow-up in 4 weeks.

## 2018-12-27 ENCOUNTER — Ambulatory Visit: Payer: PRIVATE HEALTH INSURANCE | Admitting: Emergency Medicine

## 2019-01-06 ENCOUNTER — Encounter: Payer: No Typology Code available for payment source | Admitting: Obstetrics and Gynecology

## 2019-01-11 ENCOUNTER — Ambulatory Visit: Payer: PRIVATE HEALTH INSURANCE | Admitting: Emergency Medicine

## 2019-01-11 ENCOUNTER — Encounter: Payer: Self-pay | Admitting: Emergency Medicine

## 2019-01-11 ENCOUNTER — Other Ambulatory Visit: Payer: Self-pay

## 2019-01-11 VITALS — BP 160/80 | HR 95 | Temp 98.2°F | Resp 17 | Ht 58.25 in | Wt 160.4 lb

## 2019-01-11 DIAGNOSIS — I10 Essential (primary) hypertension: Secondary | ICD-10-CM

## 2019-01-11 MED ORDER — LISINOPRIL 40 MG PO TABS: 40.0000 mg | ORAL_TABLET | Freq: Every day | ORAL | 3 refills | Status: AC

## 2019-01-11 NOTE — Assessment & Plan Note (Signed)
Still elevated.  Will increase lisinopril to 40 mg a day.  Follow-up in 4 weeks.

## 2019-01-11 NOTE — Progress Notes (Signed)
BP Readings from Last 3 Encounters:  01/11/19 (!) 160/80  11/28/18 (!) 161/71  10/30/18 140/78   Jenny Montgomery 58 y.o.   Chief Complaint  Patient presents with  . Hypertension    4 week f/u  . Medication Refill    amlodipine and lisinopril    HISTORY OF PRESENT ILLNESS: This is a 58 y.o. female with history of hypertension here for follow-up and medication refill.  Only taking lisinopril 20 mg daily.  Amlodipine gave her side effects.  Asymptomatic.  Has no complaints or medical concerns today.  HPI   Prior to Admission medications   Medication Sig Start Date End Date Taking? Authorizing Provider  amLODipine (NORVASC) 5 MG tablet Take 1 tablet (5 mg total) by mouth daily. 10/24/18  Yes Hagler, Arlys John, MD  lisinopril (PRINIVIL,ZESTRIL) 20 MG tablet Take 1 tablet (20 mg total) by mouth daily. 11/28/18  Yes Mella Inclan, Eilleen Kempf, MD  triamcinolone cream (KENALOG) 0.1 % Apply 1 application topically 2 (two) times daily. 11/28/18  Yes Georgina Quint, MD    Allergies  Allergen Reactions  . Amlodipine Other (See Comments)    Per patient causes leg cramps  . Ibuprofen Other (See Comments)    ulcers    Patient Active Problem List   Diagnosis Date Noted  . Essential hypertension 10/30/2018    Past Medical History:  Diagnosis Date  . GERD (gastroesophageal reflux disease)   . Heart murmur   . Hypertension   . Ulcer     Past Surgical History:  Procedure Laterality Date  . APPENDECTOMY    . CESAREAN SECTION     2 c sections  . TUBAL LIGATION    . WISDOM TOOTH EXTRACTION      Social History   Socioeconomic History  . Marital status: Married    Spouse name: Not on file  . Number of children: Not on file  . Years of education: Not on file  . Highest education level: Not on file  Occupational History  . Not on file  Social Needs  . Financial resource strain: Not on file  . Food insecurity:    Worry: Not on file    Inability: Not on file  .  Transportation needs:    Medical: Not on file    Non-medical: Not on file  Tobacco Use  . Smoking status: Current Every Day Smoker    Packs/day: 0.50    Years: 30.00    Pack years: 15.00    Types: Cigarettes  . Smokeless tobacco: Never Used  Substance and Sexual Activity  . Alcohol use: No  . Drug use: No  . Sexual activity: Not on file  Lifestyle  . Physical activity:    Days per week: Not on file    Minutes per session: Not on file  . Stress: Not on file  Relationships  . Social connections:    Talks on phone: Not on file    Gets together: Not on file    Attends religious service: Not on file    Active member of club or organization: Not on file    Attends meetings of clubs or organizations: Not on file    Relationship status: Not on file  . Intimate partner violence:    Fear of current or ex partner: Not on file    Emotionally abused: Not on file    Physically abused: Not on file    Forced sexual activity: Not on file  Other Topics Concern  .  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    Family History  Problem Relation Age of Onset  . Kidney disease Mother   . Hypertension Mother   . Anemia Mother   . Stroke Mother 53  . Cancer Father        throat cancer  . Hypertension Father   . Heart disease Maternal Grandmother   . Glaucoma Maternal Grandmother   . Diabetes Sister   . Heart attack Son      Review of Systems  Constitutional: Negative.  Negative for fever.  HENT: Negative.   Eyes: Negative.  Negative for blurred vision and double vision.  Respiratory: Negative.  Negative for cough and shortness of breath.   Cardiovascular: Negative.  Negative for chest pain and palpitations.  Gastrointestinal: Negative.  Negative for abdominal pain, diarrhea, nausea and vomiting.  Skin: Negative.  Negative for rash.    Neurological: Negative.  Negative for dizziness and headaches.  All other systems reviewed and are negative.  Vitals:   01/11/19 1145 01/11/19 1150  BP: (!) 175/82 (!) 160/80  Pulse: 95   Resp: 17   Temp: 98.2 F (36.8 C)   SpO2: 100%      Physical Exam Vitals signs reviewed.  Constitutional:      Appearance: Normal appearance.  HENT:     Head: Normocephalic.     Mouth/Throat:     Mouth: Mucous membranes are moist.     Pharynx: Oropharynx is clear.  Eyes:     Extraocular Movements: Extraocular movements intact.     Conjunctiva/sclera: Conjunctivae normal.     Pupils: Pupils are equal, round, and reactive to light.  Neck:     Musculoskeletal: Normal range of motion and neck supple.  Cardiovascular:     Rate and Rhythm: Normal rate and regular rhythm.     Heart sounds: Normal heart sounds.  Pulmonary:     Effort: Pulmonary effort is normal.     Breath sounds: Normal breath sounds.  Musculoskeletal: Normal range of motion.  Skin:    General: Skin is warm and dry.     Capillary Refill: Capillary refill takes less than 2 seconds.  Neurological:     General: No focal deficit present.     Mental Status: She is alert and oriented to person, place, and time.      ASSESSMENT & PLAN: Essential hypertension Still elevated.  Will increase lisinopril to 40 mg a day.  Follow-up in 4 weeks.  Jenny Montgomery was seen today for hypertension and medication refill.  Diagnoses and all orders for this visit:  Essential hypertension -     lisinopril (PRINIVIL,ZESTRIL) 40 MG tablet; Take 1 tablet (40 mg total) by mouth daily.    Patient Instructions       If you have lab work done today you will be contacted with your lab results within the next 2 weeks.  If you have not heard from [place] then please contact us. The fastest way to get your results is to register for My Chart.   IF you received an x-ray today, you will receive an invoice from [facility]. Please contact  [facility] at [phone] with questions or concerns regarding your invoice.   IF you received labwork today, you will receive an invoice from [place]. Please  contact [facility] at [phone] with questions or concerns regarding your invoice.   Our billing staff will not be able to assist you with questions regarding bills from these companies.  You will be contacted with the lab results as soon as they are available. The fastest way to get your results is to activate your My Chart account. Instructions are located on the last page of this paperwork. If you have not heard from [place] regarding the results in 2 weeks, please contact this office.     Hypertension Hypertension, commonly called high blood pressure, is when the force of blood pumping through the arteries is too strong. The arteries are the blood vessels that carry blood from the heart throughout the body. Hypertension forces the heart to work harder to pump blood and may cause arteries to become narrow or stiff. Having untreated or uncontrolled hypertension can cause heart attacks, strokes, kidney disease, and other problems. A blood pressure reading consists of a higher number over a lower number. Ideally, your blood pressure should be below 120/80. The first ("top") number is called the systolic pressure. It is a measure of the pressure in your arteries as your heart beats. The second ("bottom") number is called the diastolic pressure. It is a measure of the pressure in your arteries as the heart relaxes. What are the causes? The cause of this condition is not known. What increases the risk? Some risk factors for high blood pressure are under your control. Others are not. Factors you can change  Smoking.  Having type 2 diabetes mellitus, high cholesterol, or both.  Not getting enough exercise or physical activity.  Being overweight.  Having too much fat, sugar, calories, or salt (sodium) in your diet.  Drinking too  much alcohol. Factors that are difficult or impossible to change  Having chronic kidney disease.  Having a family history of high blood pressure.  Age. Risk increases with age.  Race. You may be at higher risk if you are African-American.  Gender. Men are at higher risk than women before age 92. After age 50, women are at higher risk than men.  Having obstructive sleep apnea.  Stress. What are the signs or symptoms? Extremely high blood pressure (hypertensive crisis) may cause:  Headache.  Anxiety.  Shortness of breath.  Nosebleed.  Nausea and vomiting.  Severe chest pain.  Jerky movements you cannot control (seizures). How is this diagnosed? This condition is diagnosed by measuring your blood pressure while you are seated, with your arm resting on a surface. The cuff of the blood pressure monitor will be placed directly against the skin of your upper arm at the level of your heart. It should be measured at least twice using the same arm. Certain conditions can cause a difference in blood pressure between your right and left arms. Certain factors can cause blood pressure readings to be lower or higher than normal (elevated) for a short period of time:  When your blood pressure is higher when you are in a health care provider's office than when you are at home, this is called white coat hypertension. Most people with this condition do not need medicines.  When your blood pressure is higher at home than when you are in a health care provider's office, this is called masked hypertension. Most people with this condition may need medicines to control blood pressure. If you have a high blood pressure reading during one visit or you have normal blood pressure with other risk factors:  You may be asked to return on a different day to have your blood pressure checked again.  You may be asked to monitor your blood pressure at home for 1 week or longer. If you are diagnosed with  hypertension, you may have other blood or imaging tests to help your health care provider understand your overall risk for other conditions. How is this treated? This condition is treated by making healthy lifestyle changes, such as eating healthy foods, exercising more, and reducing your alcohol intake. Your health care provider may prescribe medicine if lifestyle changes are not enough to get your blood pressure under control, and if:  Your systolic blood pressure is above 130.  Your diastolic blood pressure is above 80. Your personal target blood pressure may vary depending on your medical conditions, your age, and other factors. Follow these instructions at home: Eating and drinking   Eat a diet that is high in fiber and potassium, and low in sodium, added sugar, and fat. An example eating plan is called the DASH (Dietary Approaches to Stop Hypertension) diet. To eat this way: ? Eat plenty of fresh fruits and vegetables. Try to fill half of your plate at each meal with fruits and vegetables. ? Eat whole grains, such as whole wheat pasta, brown rice, or whole grain bread. Fill about one quarter of your plate with whole grains. ? Eat or drink low-fat dairy products, such as skim milk or low-fat yogurt. ? Avoid fatty cuts of meat, processed or cured meats, and poultry with skin. Fill about one quarter of your plate with lean proteins, such as fish, chicken without skin, beans, eggs, and tofu. ? Avoid premade and processed foods. These tend to be higher in sodium, added sugar, and fat.  Reduce your daily sodium intake. Most people with hypertension should eat less than 1,500 mg of sodium a day.  Limit alcohol intake to no more than 1 drink a day for nonpregnant women and 2 drinks a day for men. One drink equals 12 oz of beer, 5 oz of wine, or 1 oz of hard liquor. Lifestyle   Work with your health care provider to maintain a healthy body weight or to lose weight. Ask what an ideal weight is  for you.  Get at least 30 minutes of exercise that causes your heart to beat faster (aerobic exercise) most days of the week. Activities may include walking, swimming, or biking.  Include exercise to strengthen your muscles (resistance exercise), such as pilates or lifting weights, as part of your weekly exercise routine. Try to do these types of exercises for 30 minutes at least 3 days a week.  Do not use any products that contain nicotine or tobacco, such as cigarettes and e-cigarettes. If you need help quitting, ask your health care provider.  Monitor your blood pressure at home as told by your health care provider.  Keep all follow-up visits as told by your health care provider. This is important. Medicines  Take over-the-counter and prescription medicines only as told by your health care provider. Follow directions carefully. Blood pressure medicines must be taken as prescribed.  Do not skip doses of blood pressure medicine. Doing this puts you at risk for problems and can make the medicine less effective.  Ask your health care provider about side effects or reactions to medicines that you should watch for. Contact a health care provider if:  You think you are having a reaction to a medicine you are taking.  You have  headaches that keep coming back (recurring).  You feel dizzy.  You have swelling in your ankles.  You have trouble with your vision. Get help right away if:  You develop a severe headache or confusion.  You have unusual weakness or numbness.  You feel faint.  You have severe pain in your chest or abdomen.  You vomit repeatedly.  You have trouble breathing. Summary  Hypertension is when the force of blood pumping through your arteries is too strong. If this condition is not controlled, it may put you at risk for serious complications.  Your personal target blood pressure may vary depending on your medical conditions, your age, and other factors. For most  people, a normal blood pressure is less than 120/80.  Hypertension is treated with lifestyle changes, medicines, or a combination of both. Lifestyle changes include weight loss, eating a healthy, low-sodium diet, exercising more, and limiting alcohol. This information is not intended to replace advice given to you by your health care provider. Make sure you discuss any questions you have with your health care provider. Document Released: 11/27/2005 Document Revised: 10/25/2016 Document Reviewed: 10/25/2016 Elsevier Interactive Patient Education  2019 Elsevier Inc.      Edwina BarthMiguel Treston Coker, MD [facility]

## 2019-01-11 NOTE — Patient Instructions (Addendum)
   If you have lab work done today you will be contacted with your lab results within the next 2 weeks.  If you have not heard from us then please contact us. The fastest way to get your results is to register for My Chart.   IF you received an x-ray today, you will receive an invoice from [facility]. Please contact [facility] at [phone] with questions or concerns regarding your invoice.   IF you received labwork today, you will receive an invoice from [facility]. Please contact [facility] at [phone] with questions or concerns regarding your invoice.   Our billing staff will not be able to assist you with questions regarding bills from these companies.  You will be contacted with the lab results as soon as they are available. The fastest way to get your results is to activate your My Chart account. Instructions are located on the last page of this paperwork. If you have not heard from us regarding the results in 2 weeks, please contact this office.       Hypertension Hypertension, commonly called high blood pressure, is when the force of blood pumping through the arteries is too strong. The arteries are the blood vessels that carry blood from the heart throughout the body. Hypertension forces the heart to work harder to pump blood and may cause arteries to become narrow or stiff. Having untreated or uncontrolled hypertension can cause heart attacks, strokes, kidney disease, and other problems. A blood pressure reading consists of a higher number over a lower number. Ideally, your blood pressure should be below 120/80. The first ("top") number is called the systolic pressure. It is a measure of the pressure in your arteries as your heart beats. The second ("bottom") number is called the diastolic pressure. It is a measure of the pressure in your arteries as the heart relaxes. What are the causes? The cause of this condition is not known. What increases the  risk? Some risk factors for high blood pressure are under your control. Others are not. Factors you can change  Smoking.  Having type 2 diabetes mellitus, high cholesterol, or both.  Not getting enough exercise or physical activity.  Being overweight.  Having too much fat, sugar, calories, or salt (sodium) in your diet.  Drinking too much alcohol. Factors that are difficult or impossible to change  Having chronic kidney disease.  Having a family history of high blood pressure.  Age. Risk increases with age.  Race. You may be at higher risk if you are African-American.  Gender. Men are at higher risk than women before age 45. After age 65, women are at higher risk than men.  Having obstructive sleep apnea.  Stress. What are the signs or symptoms? Extremely high blood pressure (hypertensive crisis) may cause:  Headache.  Anxiety.  Shortness of breath.  Nosebleed.  Nausea and vomiting.  Severe chest pain.  Jerky movements you cannot control (seizures). How is this diagnosed? This condition is diagnosed by measuring your blood pressure while you are seated, with your arm resting on a surface. The cuff of the blood pressure monitor will be placed directly against the skin of your upper arm at the level of your heart. It should be measured at least twice using the same arm. Certain conditions can cause a difference in blood pressure between your right and left arms. Certain factors can cause blood pressure readings to be lower or higher than normal (elevated) for a short period of time:    When your blood pressure is higher when you are in a health care provider's office than when you are at home, this is called white coat hypertension. Most people with this condition do not need medicines.  When your blood pressure is higher at home than when you are in a health care provider's office, this is called masked hypertension. Most people with this condition may need medicines  to control blood pressure. If you have a high blood pressure reading during one visit or you have normal blood pressure with other risk factors:  You may be asked to return on a different day to have your blood pressure checked again.  You may be asked to monitor your blood pressure at home for 1 week or longer. If you are diagnosed with hypertension, you may have other blood or imaging tests to help your health care provider understand your overall risk for other conditions. How is this treated? This condition is treated by making healthy lifestyle changes, such as eating healthy foods, exercising more, and reducing your alcohol intake. Your health care provider may prescribe medicine if lifestyle changes are not enough to get your blood pressure under control, and if:  Your systolic blood pressure is above 130.  Your diastolic blood pressure is above 80. Your personal target blood pressure may vary depending on your medical conditions, your age, and other factors. Follow these instructions at home: Eating and drinking   Eat a diet that is high in fiber and potassium, and low in sodium, added sugar, and fat. An example eating plan is called the DASH (Dietary Approaches to Stop Hypertension) diet. To eat this way: ? Eat plenty of fresh fruits and vegetables. Try to fill half of your plate at each meal with fruits and vegetables. ? Eat whole grains, such as whole wheat pasta, brown rice, or whole grain bread. Fill about one quarter of your plate with whole grains. ? Eat or drink low-fat dairy products, such as skim milk or low-fat yogurt. ? Avoid fatty cuts of meat, processed or cured meats, and poultry with skin. Fill about one quarter of your plate with lean proteins, such as fish, chicken without skin, beans, eggs, and tofu. ? Avoid premade and processed foods. These tend to be higher in sodium, added sugar, and fat.  Reduce your daily sodium intake. Most people with hypertension should  eat less than 1,500 mg of sodium a day.  Limit alcohol intake to no more than 1 drink a day for nonpregnant women and 2 drinks a day for men. One drink equals 12 oz of beer, 5 oz of wine, or 1 oz of hard liquor. Lifestyle   Work with your health care provider to maintain a healthy body weight or to lose weight. Ask what an ideal weight is for you.  Get at least 30 minutes of exercise that causes your heart to beat faster (aerobic exercise) most days of the week. Activities may include walking, swimming, or biking.  Include exercise to strengthen your muscles (resistance exercise), such as pilates or lifting weights, as part of your weekly exercise routine. Try to do these types of exercises for 30 minutes at least 3 days a week.  Do not use any products that contain nicotine or tobacco, such as cigarettes and e-cigarettes. If you need help quitting, ask your health care provider.  Monitor your blood pressure at home as told by your health care provider.  Keep all follow-up visits as told by your health care provider.   This is important. Medicines  Take over-the-counter and prescription medicines only as told by your health care provider. Follow directions carefully. Blood pressure medicines must be taken as prescribed.  Do not skip doses of blood pressure medicine. Doing this puts you at risk for problems and can make the medicine less effective.  Ask your health care provider about side effects or reactions to medicines that you should watch for. Contact a health care provider if:  You think you are having a reaction to a medicine you are taking.  You have headaches that keep coming back (recurring).  You feel dizzy.  You have swelling in your ankles.  You have trouble with your vision. Get help right away if:  You develop a severe headache or confusion.  You have unusual weakness or numbness.  You feel faint.  You have severe pain in your chest or abdomen.  You vomit  repeatedly.  You have trouble breathing. Summary  Hypertension is when the force of blood pumping through your arteries is too strong. If this condition is not controlled, it may put you at risk for serious complications.  Your personal target blood pressure may vary depending on your medical conditions, your age, and other factors. For most people, a normal blood pressure is less than 120/80.  Hypertension is treated with lifestyle changes, medicines, or a combination of both. Lifestyle changes include weight loss, eating a healthy, low-sodium diet, exercising more, and limiting alcohol. This information is not intended to replace advice given to you by your health care provider. Make sure you discuss any questions you have with your health care provider. Document Released: 11/27/2005 Document Revised: 10/25/2016 Document Reviewed: 10/25/2016 Elsevier Interactive Patient Education  2019 [employer] Inc.  

## 2019-01-23 ENCOUNTER — Encounter: Payer: Self-pay | Admitting: Emergency Medicine

## 2019-02-13 ENCOUNTER — Ambulatory Visit: Payer: PRIVATE HEALTH INSURANCE | Admitting: Emergency Medicine

## 2019-02-13 ENCOUNTER — Encounter: Payer: Self-pay | Admitting: Emergency Medicine

## 2019-02-13 ENCOUNTER — Other Ambulatory Visit: Payer: Self-pay

## 2019-02-13 VITALS — BP 164/76 | HR 84 | Temp 98.5°F | Resp 18 | Ht 58.25 in | Wt 161.2 lb

## 2019-02-13 DIAGNOSIS — I10 Essential (primary) hypertension: Secondary | ICD-10-CM | POA: Diagnosis not present

## 2019-02-13 DIAGNOSIS — M549 Dorsalgia, unspecified: Secondary | ICD-10-CM | POA: Diagnosis not present

## 2019-02-13 MED ORDER — TRAMADOL HCL 50 MG PO TABS: 50.0000 mg | ORAL_TABLET | Freq: Three times a day (TID) | ORAL | 0 refills | Status: AC | PRN

## 2019-02-13 MED ORDER — CYCLOBENZAPRINE HCL 10 MG PO TABS: 10.0000 mg | ORAL_TABLET | Freq: Every day | ORAL | 0 refills | Status: AC

## 2019-02-13 NOTE — Patient Instructions (Addendum)
   If you have lab work done today you will be contacted with your lab results within the next 2 weeks.  If you have not heard from us then please contact us. The fastest way to get your results is to register for My Chart.   IF you received an x-ray today, you will receive an invoice from [facility]. Please contact [facility] at [phone] with questions or concerns regarding your invoice.   IF you received labwork today, you will receive an invoice from [facility]. Please contact [facility] at [phone] with questions or concerns regarding your invoice.   Our billing staff will not be able to assist you with questions regarding bills from these companies.  You will be contacted with the lab results as soon as they are available. The fastest way to get your results is to activate your My Chart account. Instructions are located on the last page of this paperwork. If you have not heard from us regarding the results in 2 weeks, please contact this office.     Acute Back Pain, Adult Acute back pain is sudden and usually short-lived. It is often caused by an injury to the muscles and tissues in the back. The injury may result from:  A muscle or ligament getting overstretched or torn (strained). Ligaments are tissues that connect bones to each other. Lifting something improperly can cause a back strain.  Wear and tear (degeneration) of the spinal disks. Spinal disks are circular tissue that provides cushioning between the bones of the spine (vertebrae).  Twisting motions, such as while playing sports or doing yard work.  A hit to the back.  Arthritis. You may have a physical exam, lab tests, and imaging tests to find the cause of your pain. Acute back pain usually goes away with rest and home care. Follow these instructions at home: Managing pain, stiffness, and swelling  Take over-the-counter and prescription medicines only as told by your health care  provider.  Your health care provider may recommend applying ice during the first 24-48 hours after your pain starts. To do this: ? Put ice in a plastic bag. ? Place a towel between your skin and the bag. ? Leave the ice on for 20 minutes, 2-3 times a day.  If directed, apply heat to the affected area as often as told by your health care provider. Use the heat source that your health care provider recommends, such as a moist heat pack or a heating pad. ? Place a towel between your skin and the heat source. ? Leave the heat on for 20-30 minutes. ? Remove the heat if your skin turns bright red. This is especially important if you are unable to feel pain, heat, or cold. You have a greater risk of getting burned. Activity   Do not stay in bed. Staying in bed for more than 1-2 days can delay your recovery.  Sit up and stand up straight. Avoid leaning forward when you sit, or hunching over when you stand. ? If you work at a desk, sit close to it so you do not need to lean over. Keep your chin tucked in. Keep your neck drawn back, and keep your elbows bent at a right angle. Your arms should look like the letter "L." ? Sit high and close to the steering wheel when you drive. Add lower back (lumbar) support to your car seat, if needed.  Take short walks on even surfaces as soon as you are able. Try   to increase the length of time you walk each day.  Do not sit, drive, or stand in one place for more than 30 minutes at a time. Sitting or standing for long periods of time can put stress on your back.  Do not drive or use heavy machinery while taking prescription pain medicine.  Use proper lifting techniques. When you bend and lift, use positions that put less stress on your back: ? [place] your knees. ? Keep the load close to your body. ? Avoid twisting.  Exercise regularly as told by your health care provider. Exercising helps your back heal faster and helps prevent back injuries by keeping muscles  strong and flexible.  Work with a physical therapist to make a safe exercise program, as recommended by your health care provider. Do any exercises as told by your physical therapist. Lifestyle  Maintain a healthy weight. Extra weight puts stress on your back and makes it difficult to have good posture.  Avoid activities or situations that make you feel anxious or stressed. Stress and anxiety increase muscle tension and can make back pain worse. Learn ways to manage anxiety and stress, such as through exercise. General instructions  Sleep on a firm mattress in a comfortable position. Try lying on your side with your knees slightly bent. If you lie on your back, put a pillow under your knees.  Follow your treatment plan as told by your health care provider. This may include: ? Cognitive or behavioral therapy. ? Acupuncture or massage therapy. ? Meditation or yoga. Contact a health care provider if:  You have pain that is not relieved with rest or medicine.  You have increasing pain going down into your legs or buttocks.  Your pain does not improve after 2 weeks.  You have pain at night.  You lose weight without trying.  You have a fever or chills. Get help right away if:  You develop new bowel or bladder control problems.  You have unusual weakness or numbness in your arms or legs.  You develop nausea or vomiting.  You develop abdominal pain.  You feel faint. Summary  Acute back pain is sudden and usually short-lived.  Use proper lifting techniques. When you bend and lift, use positions that put less stress on your back.  Take over-the-counter and prescription medicines and apply heat or ice as directed by your health care provider. This information is not intended to replace advice given to you by your health care provider. Make sure you discuss any questions you have with your health care provider. Document Released: 11/27/2005 Document Revised: 07/04/2018 Document  Reviewed: 07/11/2017 Elsevier Interactive Patient Education  2019 [employer].  Hypertension Hypertension, commonly called high blood pressure, is when the force of blood pumping through the arteries is too strong. The arteries are the blood vessels that carry blood from the heart throughout the body. Hypertension forces the heart to work harder to pump blood and may cause arteries to become narrow or stiff. Having untreated or uncontrolled hypertension can cause heart attacks, strokes, kidney disease, and other problems. A blood pressure reading consists of a higher number over a lower number. Ideally, your blood pressure should be below 120/80. The first ("top") number is called the systolic pressure. It is a measure of the pressure in your arteries as your heart beats. The second ("bottom") number is called the diastolic pressure. It is a measure of the pressure in your arteries as the heart relaxes. What are the causes? The cause  of this condition is not known. What increases the risk? Some risk factors for high blood pressure are under your control. Others are not. Factors you can change  Smoking.  Having type 2 diabetes mellitus, high cholesterol, or both.  Not getting enough exercise or physical activity.  Being overweight.  Having too much fat, sugar, calories, or salt (sodium) in your diet.  Drinking too much alcohol. Factors that are difficult or impossible to change  Having chronic kidney disease.  Having a family history of high blood pressure.  Age. Risk increases with age.  Race. You may be at higher risk if you are African-American.  Gender. Men are at higher risk than women before age 83. After age 46, women are at higher risk than men.  Having obstructive sleep apnea.  Stress. What are the signs or symptoms? Extremely high blood pressure (hypertensive crisis) may cause:  Headache.  Anxiety.  Shortness of breath.  Nosebleed.  Nausea and  vomiting.  Severe chest pain.  Jerky movements you cannot control (seizures). How is this diagnosed? This condition is diagnosed by measuring your blood pressure while you are seated, with your arm resting on a surface. The cuff of the blood pressure monitor will be placed directly against the skin of your upper arm at the level of your heart. It should be measured at least twice using the same arm. Certain conditions can cause a difference in blood pressure between your right and left arms. Certain factors can cause blood pressure readings to be lower or higher than normal (elevated) for a short period of time:  When your blood pressure is higher when you are in a health care provider's office than when you are at home, this is called white coat hypertension. Most people with this condition do not need medicines.  When your blood pressure is higher at home than when you are in a health care provider's office, this is called masked hypertension. Most people with this condition may need medicines to control blood pressure. If you have a high blood pressure reading during one visit or you have normal blood pressure with other risk factors:  You may be asked to return on a different day to have your blood pressure checked again.  You may be asked to monitor your blood pressure at home for 1 week or longer. If you are diagnosed with hypertension, you may have other blood or imaging tests to help your health care provider understand your overall risk for other conditions. How is this treated? This condition is treated by making healthy lifestyle changes, such as eating healthy foods, exercising more, and reducing your alcohol intake. Your health care provider may prescribe medicine if lifestyle changes are not enough to get your blood pressure under control, and if:  Your systolic blood pressure is above 130.  Your diastolic blood pressure is above 80. Your personal target blood pressure may vary  depending on your medical conditions, your age, and other factors. Follow these instructions at home: Eating and drinking   Eat a diet that is high in fiber and potassium, and low in sodium, added sugar, and fat. An example eating plan is called the DASH (Dietary Approaches to Stop Hypertension) diet. To eat this way: ? Eat plenty of fresh fruits and vegetables. Try to fill half of your plate at each meal with fruits and vegetables. ? Eat whole grains, such as whole wheat pasta, brown rice, or whole grain bread. Fill about one quarter of your plate  with whole grains. ? Eat or drink low-fat dairy products, such as skim milk or low-fat yogurt. ? Avoid fatty cuts of meat, processed or cured meats, and poultry with skin. Fill about one quarter of your plate with lean proteins, such as fish, chicken without skin, beans, eggs, and tofu. ? Avoid premade and processed foods. These tend to be higher in sodium, added sugar, and fat.  Reduce your daily sodium intake. Most people with hypertension should eat less than 1,500 mg of sodium a day.  Limit alcohol intake to no more than 1 drink a day for nonpregnant women and 2 drinks a day for men. One drink equals 12 oz of beer, 5 oz of wine, or 1 oz of hard liquor. Lifestyle   Work with your health care provider to maintain a healthy body weight or to lose weight. Ask what an ideal weight is for you.  Get at least 30 minutes of exercise that causes your heart to beat faster (aerobic exercise) most days of the week. Activities may include walking, swimming, or biking.  Include exercise to strengthen your muscles (resistance exercise), such as pilates or lifting weights, as part of your weekly exercise routine. Try to do these types of exercises for 30 minutes at least 3 days a week.  Do not use any products that contain nicotine or tobacco, such as cigarettes and e-cigarettes. If you need help quitting, ask your health care provider.  Monitor your blood  pressure at home as told by your health care provider.  Keep all follow-up visits as told by your health care provider. This is important. Medicines  Take over-the-counter and prescription medicines only as told by your health care provider. Follow directions carefully. Blood pressure medicines must be taken as prescribed.  Do not skip doses of blood pressure medicine. Doing this puts you at risk for problems and can make the medicine less effective.  Ask your health care provider about side effects or reactions to medicines that you should watch for. Contact a health care provider if:  You think you are having a reaction to a medicine you are taking.  You have headaches that keep coming back (recurring).  You feel dizzy.  You have swelling in your ankles.  You have trouble with your vision. Get help right away if:  You develop a severe headache or confusion.  You have unusual weakness or numbness.  You feel faint.  You have severe pain in your chest or abdomen.  You vomit repeatedly.  You have trouble breathing. Summary  Hypertension is when the force of blood pumping through your arteries is too strong. If this condition is not controlled, it may put you at risk for serious complications.  Your personal target blood pressure may vary depending on your medical conditions, your age, and other factors. For most people, a normal blood pressure is less than 120/80.  Hypertension is treated with lifestyle changes, medicines, or a combination of both. Lifestyle changes include weight loss, eating a healthy, low-sodium diet, exercising more, and limiting alcohol. This information is not intended to replace advice given to you by your health care provider. Make sure you discuss any questions you have with your health care provider. Document Released: 11/27/2005 Document Revised: 10/25/2016 Document Reviewed: 10/25/2016 [employer] Interactive Patient Education  2019 [employer]

## 2019-02-13 NOTE — Progress Notes (Signed)
Jenny Montgomery 58 y.o.   Chief Complaint  Patient presents with  . Hypertension    follow up    BP Readings from Last 3 Encounters:  02/13/19 (!) 164/76  01/11/19 (!) 160/80  11/28/18 (!) 161/71    HISTORY OF PRESENT ILLNESS: This is a 58 y.o. female with history of hypertension here for follow-up.  Taking lisinopril 40 mg a day. Also complaining of back pain for 1 week, related to daily activities at work. No other complaints or medical concerns today.  HPI   Prior to Admission medications   Medication Sig Start Date End Date Taking? Authorizing Provider  lisinopril (PRINIVIL,ZESTRIL) 40 MG tablet Take 1 tablet (40 mg total) by mouth daily. 01/11/19  Yes Dhanush Jokerst, Eilleen Kempf, MD  triamcinolone cream (KENALOG) 0.1 % Apply 1 application topically 2 (two) times daily. 11/28/18  Yes Georgina Quint, MD    Allergies  Allergen Reactions  . Amlodipine Other (See Comments)    Per patient causes leg cramps  . Ibuprofen Other (See Comments)    ulcers    Patient Active Problem List   Diagnosis Date Noted  . Essential hypertension 10/30/2018    Past Medical History:  Diagnosis Date  . GERD (gastroesophageal reflux disease)   . Heart murmur   . Hypertension   . Ulcer     Past Surgical History:  Procedure Laterality Date  . APPENDECTOMY    . CESAREAN SECTION     2 c sections  . TUBAL LIGATION    . WISDOM TOOTH EXTRACTION      Social History   Socioeconomic History  . Marital status: Married    Spouse name: Not on file  . Number of children: Not on file  . Years of education: Not on file  . Highest education level: Not on file  Occupational History  . Not on file  Social Needs  . Financial resource strain: Not on file  . Food insecurity:    Worry: Not on file    Inability: Not on file  . Transportation needs:    Medical: Not on file    Non-medical: Not on file  Tobacco Use  . Smoking status: Current Every Day Smoker    Packs/day: 0.50    Years:  30.00    Pack years: 15.00    Types: Cigarettes  . Smokeless tobacco: Never Used  Substance and Sexual Activity  . Alcohol use: No  . Drug use: No  . Sexual activity: Not on file  Lifestyle  . Physical activity:    Days per week: Not on file    Minutes per session: Not on file  . Stress: Not on file  Relationships  . Social connections:    Talks on phone: Not on file    Gets together: Not on file    Attends religious service: Not on file    Active member of club or organization: Not on file    Attends meetings of clubs or organizations: Not on file    Relationship status: Not on file  . Intimate partner violence:    Fear of current or ex partner: Not on file    Emotionally abused: Not on file    Physically abused: Not on file    Forced sexual activity: Not on file  Other Topics Concern  . 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    Family History  Problem Relation Age of Onset  . Kidney disease Mother   . Hypertension Mother   . Anemia Mother   . Stroke Mother 64  . Cancer Father        throat cancer  . Hypertension Father   . Heart disease Maternal Grandmother   . Glaucoma Maternal Grandmother   . Diabetes Sister   . Heart attack Son      Review of Systems  Constitutional: Negative.  Negative for chills and fever.  HENT: Negative.   Eyes: Negative.   Respiratory: Negative.   Cardiovascular: Negative.   Gastrointestinal: Negative.   Musculoskeletal: Positive for back pain.  Neurological: Negative.   Endo/Heme/Allergies: Negative.   All other systems reviewed and are negative.  Vitals:   02/13/19 1347  BP: (!) 164/76  Pulse: 84  Resp: 18  Temp: 98.5 F (36.9 C)  SpO2: 98%     Physical Exam Vitals signs reviewed.  Constitutional:      Appearance: Normal appearance.  HENT:     Head: Normocephalic and  atraumatic.     Nose: Nose normal.     Mouth/Throat:     Mouth: Mucous membranes are moist.     Pharynx: Oropharynx is clear.  Neck:     Musculoskeletal: Normal range of motion and neck supple.  Cardiovascular:     Rate and Rhythm: Normal rate and regular rhythm.     Heart sounds: Normal heart sounds.  Pulmonary:     Effort: Pulmonary effort is normal.     Breath sounds: Normal breath sounds.  Musculoskeletal:     Comments: Back: Positive tenderness and muscle spasm to upper mid and low back areas.  No bony tenderness.  Decreased range of motion.  Skin:    General: Skin is warm and dry.  Neurological:     General: No focal deficit present.     Mental Status: She is alert and oriented to person, place, and time.  Psychiatric:        Mood and Affect: Mood normal.        Behavior: Behavior normal.    A total of 25 minutes was spent in the room with the patient, greater than 50% of which was in counseling/coordination of care regarding hypertension, back pain, treatment, medications, prognosis, and need for follow-up.   ASSESSMENT & PLAN: Jenny Montgomery was seen today for hypertension.  Diagnoses and all orders for this visit:  Acute bilateral back pain, unspecified back location -     cyclobenzaprine (FLEXERIL) 10 MG tablet; Take 1 tablet (10 mg total) by mouth at bedtime. -     traMADol (ULTRAM) 50 MG tablet; Take 1 tablet (50 mg total) by mouth every 8 (eight) hours as needed for severe pain.  Essential hypertension    Patient Instructions       If you have lab work done today you will be contacted with your lab results within the next 2 weeks.  If you have not heard from [place] then please contact us. The fastest way to get your results is to register for My Chart.   IF you received an x-ray today, you will receive an invoice from [facility]. Please contact [facility] at [phone] with questions or concerns regarding your invoice.   IF you received  labwork today, you will receive an invoice from [place]. Please contact [facility] at [phone] with questions or  concerns regarding your invoice.   Our billing staff will not be able to assist you with questions regarding bills from these companies.  You will be contacted with the lab results as soon as they are available. The fastest way to get your results is to activate your My Chart account. Instructions are located on the last page of this paperwork. If you have not heard from us regarding the results in 2 weeks, please contact this office.     Acute Back Pain, Adult Acute back pain is sudden and usually short-lived. It is often caused by an injury to the muscles and tissues in the back. The injury may result from:  A muscle or ligament getting overstretched or torn (strained). Ligaments are tissues that connect bones to each other. Lifting something improperly can cause a back strain.  Wear and tear (degeneration) of the spinal disks. Spinal disks are circular tissue that provides cushioning between the bones of the spine (vertebrae).  Twisting motions, such as while playing sports or doing yard work.  A hit to the back.  Arthritis. You may have a physical exam, lab tests, and imaging tests to find the cause of your pain. Acute back pain usually goes away with rest and home care. Follow these instructions at home: Managing pain, stiffness, and swelling  Take over-the-counter and prescription medicines only as told by your health care provider.  Your health care provider may recommend applying ice during the first 24-48 hours after your pain starts. To do this: ? Put ice in a plastic bag. ? Place a towel between your skin and the bag. ? Leave the ice on for 20 minutes, 2-3 times a day.  If directed, apply heat to the affected area as often as told by your health care provider. Use the heat source that your health care provider recommends, such as a moist heat pack or a heating  pad. ? Place a towel between your skin and the heat source. ? Leave the heat on for 20-30 minutes. ? Remove the heat if your skin turns bright red. This is especially important if you are unable to feel pain, heat, or cold. You have a greater risk of getting burned. Activity   Do not stay in bed. Staying in bed for more than 1-2 days can delay your recovery.  Sit up and stand up straight. Avoid leaning forward when you sit, or hunching over when you stand. ? If you work at a desk, sit close to it so you do not need to lean over. Keep your chin tucked in. Keep your neck drawn back, and keep your elbows bent at a right angle. Your arms should look like the letter "L." ? Sit high and close to the steering wheel when you drive. Add lower back (lumbar) support to your car seat, if needed.  Take short walks on even surfaces as soon as you are able. Try to increase the length of time you walk each day.  Do not sit, drive, or stand in one place for more than 30 minutes at a time. Sitting or standing for long periods of time can put stress on your back.  Do not drive or use heavy machinery while taking prescription pain medicine.  Use proper lifting techniques. When you bend and lift, use positions that put less stress on your back: ? [place] your knees. ? Keep the load close to your body. ? Avoid twisting.  Exercise regularly as told by your health care provider.  Exercising helps your back heal faster and helps prevent back injuries by keeping muscles strong and flexible.  Work with a physical therapist to make a safe exercise program, as recommended by your health care provider. Do any exercises as told by your physical therapist. Lifestyle  Maintain a healthy weight. Extra weight puts stress on your back and makes it difficult to have good posture.  Avoid activities or situations that make you feel anxious or stressed. Stress and anxiety increase muscle tension and can make back pain worse.  Learn ways to manage anxiety and stress, such as through exercise. General instructions  Sleep on a firm mattress in a comfortable position. Try lying on your side with your knees slightly bent. If you lie on your back, put a pillow under your knees.  Follow your treatment plan as told by your health care provider. This may include: ? Cognitive or behavioral therapy. ? Acupuncture or massage therapy. ? Meditation or yoga. Contact a health care provider if:  You have pain that is not relieved with rest or medicine.  You have increasing pain going down into your legs or buttocks.  Your pain does not improve after 2 weeks.  You have pain at night.  You lose weight without trying.  You have a fever or chills. Get help right away if:  You develop new bowel or bladder control problems.  You have unusual weakness or numbness in your arms or legs.  You develop nausea or vomiting.  You develop abdominal pain.  You feel faint. Summary  Acute back pain is sudden and usually short-lived.  Use proper lifting techniques. When you bend and lift, use positions that put less stress on your back.  Take over-the-counter and prescription medicines and apply heat or ice as directed by your health care provider. This information is not intended to replace advice given to you by your health care provider. Make sure you discuss any questions you have with your health care provider. Document Released: 11/27/2005 Document Revised: 07/04/2018 Document Reviewed: 07/11/2017 Elsevier Interactive Patient Education  2019 [employer].  Hypertension Hypertension, commonly called high blood pressure, is when the force of blood pumping through the arteries is too strong. The arteries are the blood vessels that carry blood from the heart throughout the body. Hypertension forces the heart to work harder to pump blood and may cause arteries to become narrow or stiff. Having untreated or uncontrolled  hypertension can cause heart attacks, strokes, kidney disease, and other problems. A blood pressure reading consists of a higher number over a lower number. Ideally, your blood pressure should be below 120/80. The first ("top") number is called the systolic pressure. It is a measure of the pressure in your arteries as your heart beats. The second ("bottom") number is called the diastolic pressure. It is a measure of the pressure in your arteries as the heart relaxes. What are the causes? The cause of this condition is not known. What increases the risk? Some risk factors for high blood pressure are under your control. Others are not. Factors you can change  Smoking.  Having type 2 diabetes mellitus, high cholesterol, or both.  Not getting enough exercise or physical activity.  Being overweight.  Having too much fat, sugar, calories, or salt (sodium) in your diet.  Drinking too much alcohol. Factors that are difficult or impossible to change  Having chronic kidney disease.  Having a family history of high blood pressure.  Age. Risk increases with age.  Race.  You may be at higher risk if you are African-American.  Gender. Men are at higher risk than women before age 54. After age 27, women are at higher risk than men.  Having obstructive sleep apnea.  Stress. What are the signs or symptoms? Extremely high blood pressure (hypertensive crisis) may cause:  Headache.  Anxiety.  Shortness of breath.  Nosebleed.  Nausea and vomiting.  Severe chest pain.  Jerky movements you cannot control (seizures). How is this diagnosed? This condition is diagnosed by measuring your blood pressure while you are seated, with your arm resting on a surface. The cuff of the blood pressure monitor will be placed directly against the skin of your upper arm at the level of your heart. It should be measured at least twice using the same arm. Certain conditions can cause a difference in blood  pressure between your right and left arms. Certain factors can cause blood pressure readings to be lower or higher than normal (elevated) for a short period of time:  When your blood pressure is higher when you are in a health care provider's office than when you are at home, this is called white coat hypertension. Most people with this condition do not need medicines.  When your blood pressure is higher at home than when you are in a health care provider's office, this is called masked hypertension. Most people with this condition may need medicines to control blood pressure. If you have a high blood pressure reading during one visit or you have normal blood pressure with other risk factors:  You may be asked to return on a different day to have your blood pressure checked again.  You may be asked to monitor your blood pressure at home for 1 week or longer. If you are diagnosed with hypertension, you may have other blood or imaging tests to help your health care provider understand your overall risk for other conditions. How is this treated? This condition is treated by making healthy lifestyle changes, such as eating healthy foods, exercising more, and reducing your alcohol intake. Your health care provider may prescribe medicine if lifestyle changes are not enough to get your blood pressure under control, and if:  Your systolic blood pressure is above 130.  Your diastolic blood pressure is above 80. Your personal target blood pressure may vary depending on your medical conditions, your age, and other factors. Follow these instructions at home: Eating and drinking   Eat a diet that is high in fiber and potassium, and low in sodium, added sugar, and fat. An example eating plan is called the DASH (Dietary Approaches to Stop Hypertension) diet. To eat this way: ? Eat plenty of fresh fruits and vegetables. Try to fill half of your plate at each meal with fruits and vegetables. ? Eat whole  grains, such as whole wheat pasta, brown rice, or whole grain bread. Fill about one quarter of your plate with whole grains. ? Eat or drink low-fat dairy products, such as skim milk or low-fat yogurt. ? Avoid fatty cuts of meat, processed or cured meats, and poultry with skin. Fill about one quarter of your plate with lean proteins, such as fish, chicken without skin, beans, eggs, and tofu. ? Avoid premade and processed foods. These tend to be higher in sodium, added sugar, and fat.  Reduce your daily sodium intake. Most people with hypertension should eat less than 1,500 mg of sodium a day.  Limit alcohol intake to no more than 1 drink a  day for nonpregnant women and 2 drinks a day for men. One drink equals 12 oz of beer, 5 oz of wine, or 1 oz of hard liquor. Lifestyle   Work with your health care provider to maintain a healthy body weight or to lose weight. Ask what an ideal weight is for you.  Get at least 30 minutes of exercise that causes your heart to beat faster (aerobic exercise) most days of the week. Activities may include walking, swimming, or biking.  Include exercise to strengthen your muscles (resistance exercise), such as pilates or lifting weights, as part of your weekly exercise routine. Try to do these types of exercises for 30 minutes at least 3 days a week.  Do not use any products that contain nicotine or tobacco, such as cigarettes and e-cigarettes. If you need help quitting, ask your health care provider.  Monitor your blood pressure at home as told by your health care provider.  Keep all follow-up visits as told by your health care provider. This is important. Medicines  Take over-the-counter and prescription medicines only as told by your health care provider. Follow directions carefully. Blood pressure medicines must be taken as prescribed.  Do not skip doses of blood pressure medicine. Doing this puts you at risk for problems and can make the medicine less  effective.  Ask your health care provider about side effects or reactions to medicines that you should watch for. Contact a health care provider if:  You think you are having a reaction to a medicine you are taking.  You have headaches that keep coming back (recurring).  You feel dizzy.  You have swelling in your ankles.  You have trouble with your vision. Get help right away if:  You develop a severe headache or confusion.  You have unusual weakness or numbness.  You feel faint.  You have severe pain in your chest or abdomen.  You vomit repeatedly.  You have trouble breathing. Summary  Hypertension is when the force of blood pumping through your arteries is too strong. If this condition is not controlled, it may put you at risk for serious complications.  Your personal target blood pressure may vary depending on your medical conditions, your age, and other factors. For most people, a normal blood pressure is less than 120/80.  Hypertension is treated with lifestyle changes, medicines, or a combination of both. Lifestyle changes include weight loss, eating a healthy, low-sodium diet, exercising more, and limiting alcohol. This information is not intended to replace advice given to you by your health care provider. Make sure you discuss any questions you have with your health care provider. Document Released: 11/27/2005 Document Revised: 10/25/2016 Document Reviewed: 10/25/2016 Elsevier Interactive Patient Education  2019 [employer]      Edwina Barth, MD [facility]

## 2019-04-15 ENCOUNTER — Ambulatory Visit: Payer: PRIVATE HEALTH INSURANCE | Admitting: Emergency Medicine

## 2020-02-02 ENCOUNTER — Other Ambulatory Visit: Payer: Self-pay | Admitting: Emergency Medicine

## 2020-02-02 DIAGNOSIS — I10 Essential (primary) hypertension: Secondary | ICD-10-CM
